# Patient Record
Sex: Female | Born: 1975 | Race: White | Hispanic: No | Marital: Married | Smoking: Former smoker
Health system: Southern US, Community
[De-identification: ages and names within clinical notes are randomized; demographics above are authoritative.]

## PROBLEM LIST (undated history)

## (undated) DIAGNOSIS — I10 Essential (primary) hypertension: Secondary | ICD-10-CM

## (undated) HISTORY — DX: Essential (primary) hypertension: I10

---

## 2005-04-27 LAB — HM HIV SCREENING LAB: HM HIV Screening: NEGATIVE

## 2013-05-22 ENCOUNTER — Ambulatory Visit: Payer: Self-pay | Admitting: Family Medicine

## 2014-12-03 ENCOUNTER — Ambulatory Visit
Admit: 2014-12-03 | Disposition: A | Discharge status: Home / Self Care | Payer: Self-pay | Attending: Family Medicine | Admitting: Family Medicine

## 2016-04-18 ENCOUNTER — Ambulatory Visit (INDEPENDENT_AMBULATORY_CARE_PROVIDER_SITE_OTHER): Payer: BLUE CROSS/BLUE SHIELD | Admitting: Family Medicine

## 2016-04-18 ENCOUNTER — Encounter: Payer: Self-pay | Admitting: Family Medicine

## 2016-04-18 VITALS — BP 102/62 | HR 70 | Ht 66.0 in | Wt 195.0 lb

## 2016-04-18 DIAGNOSIS — F329 Major depressive disorder, single episode, unspecified: Secondary | ICD-10-CM

## 2016-04-18 DIAGNOSIS — F32A Depression, unspecified: Secondary | ICD-10-CM

## 2016-04-18 DIAGNOSIS — Z0000 Encounter for general adult medical examination without abnormal findings: Secondary | ICD-10-CM | POA: Diagnosis not present

## 2016-04-18 DIAGNOSIS — Z124 Encounter for screening for malignant neoplasm of cervix: Secondary | ICD-10-CM

## 2016-04-18 MED ORDER — SERTRALINE HCL 50 MG PO TABS: 50.0000 mg | ORAL_TABLET | Freq: Every day | ORAL | 3 refills | Status: DC

## 2016-04-18 NOTE — Progress Notes (Signed)
Name: Shannon Velazquez   MRN: [id number]    DOB: 01/23/1976   Date:04/18/2016       Progress Note  Subjective  Chief Complaint  Chief Complaint  Patient presents with  . Annual Exam    with pap    Patient presents for pap and pelvic exam.    No problem-specific Assessment & Plan notes found for this encounter.   History reviewed. No pertinent past medical history.  History reviewed. No pertinent surgical history.  Family History  Problem Relation Age of Onset  . Diabetes Paternal Grandfather   . Heart disease Paternal Grandfather     Social History   Social History  . Marital status: Married    Spouse name: N/A  . Number of children: N/A  . Years of education: N/A   Occupational History  . Not on file.   Social History Main Topics  . Smoking status: Former Research scientist (life sciences)  . Smokeless tobacco: Never Used  . Alcohol use Not on file  . Drug use: Unknown  . Sexual activity: Yes   Other Topics Concern  . Not on file   Social History Narrative  . No narrative on file    Allergies not on file   Review of Systems  Reason unable to perform ROS: no menstrual issues/ no abn paps/ breast cysts noted in past.  Constitutional: Negative for chills, fever, malaise/fatigue and weight loss.  HENT: Negative for ear discharge, ear pain and sore throat.   Eyes: Negative for blurred vision.  Respiratory: Negative for cough, sputum production, shortness of breath and wheezing.   Cardiovascular: Negative for chest pain, palpitations and leg swelling.  Gastrointestinal: Negative for abdominal pain, blood in stool, constipation, diarrhea, heartburn, melena and nausea.  Genitourinary: Positive for dysuria, frequency and urgency. Negative for flank pain and hematuria.  Musculoskeletal: Positive for joint pain. Negative for back pain, myalgias and neck pain.  Skin: Negative for rash.  Neurological: Negative for dizziness, tingling, sensory change, focal weakness and headaches.   Endo/Heme/Allergies: Negative for environmental allergies and polydipsia. Bruises/bleeds easily.  Psychiatric/Behavioral: Positive for depression. Negative for suicidal ideas. The patient is not nervous/anxious and does not have insomnia.        Depressed mood/ activity advoidance     Objective  Vitals:   04/18/16 0953  BP: 102/62  Pulse: 70  Weight: 195 lb (88.5 kg)  Height: 5\' 6"  (1.676 m)    Physical Exam  Constitutional: She is well-developed, well-nourished, and in no distress. No distress.  HENT:  Head: Normocephalic and atraumatic.  Right Ear: External ear normal.  Left Ear: External ear normal.  Nose: Nose normal.  Mouth/Throat: Oropharynx is clear and moist.  Eyes: Conjunctivae and EOM are normal. Pupils are equal, round, and reactive to light. Right eye exhibits no discharge. Left eye exhibits no discharge.  Neck: Normal range of motion. Neck supple. No JVD present. No thyromegaly present.  Cardiovascular: Normal rate, regular rhythm, normal heart sounds and intact distal pulses.  Exam reveals no gallop and no friction rub.   No murmur heard. Pulmonary/Chest: Effort normal and breath sounds normal. No respiratory distress. She has no wheezes. She has no rales. Right breast exhibits no inverted nipple, no mass, no nipple discharge, no skin change and no tenderness. Left breast exhibits no inverted nipple, no mass, no nipple discharge, no skin change and no tenderness. Breasts are symmetrical.  Abdominal: Soft. Bowel sounds are normal. She exhibits no mass. There is no tenderness. There is no guarding.  Genitourinary: Vagina normal, uterus normal, cervix normal, right adnexa normal and left adnexa normal. Rectal exam shows guaiac negative stool. No vaginal discharge found.  Musculoskeletal: Normal range of motion. She exhibits no edema.  Lymphadenopathy:    She has no cervical adenopathy.  Neurological: She is alert. She has normal reflexes.  Skin: Skin is warm and dry.  She is not diaphoretic.  Psychiatric: Mood and affect normal.  Nursing note and vitals reviewed.     Assessment & Plan  Problem List Items Addressed This Visit    None    Visit Diagnoses    Annual physical exam    -  Primary   Relevant Orders   Pap IG (Image Guided)   Depression       Relevant Medications   sertraline (ZOLOFT) 50 MG tablet   Other Relevant Orders   Ambulatory referral to Psychiatry   Cervical cancer screening       Relevant Orders   Pap IG (Image Guided)        Dr. Otilio Miu [facility]  04/18/16

## 2016-04-20 LAB — PAP IG (IMAGE GUIDED): PAP SMEAR COMMENT: 0

## 2016-04-23 ENCOUNTER — Other Ambulatory Visit: Payer: Self-pay

## 2016-04-23 MED ORDER — SULFAMETHOXAZOLE-TRIMETHOPRIM 800-160 MG PO TABS: 1.0000 | ORAL_TABLET | Freq: Two times a day (BID) | ORAL | 0 refills | Status: DC

## 2016-05-21 ENCOUNTER — Other Ambulatory Visit: Payer: Self-pay

## 2016-05-25 ENCOUNTER — Other Ambulatory Visit: Payer: Self-pay

## 2016-05-25 ENCOUNTER — Telehealth: Payer: Self-pay

## 2016-05-25 NOTE — Telephone Encounter (Signed)
[facility] called to say that they have made "several attempts to sched an appt with the patient and have had no returned phone call". They are going to take the patient referral out of their work que as they are assuming she does not want to schedule appt with them. I tried to call the patient and had to leave a message

## 2017-05-06 ENCOUNTER — Encounter: Payer: Self-pay | Admitting: Family Medicine

## 2017-05-06 ENCOUNTER — Ambulatory Visit (INDEPENDENT_AMBULATORY_CARE_PROVIDER_SITE_OTHER): Payer: BLUE CROSS/BLUE SHIELD | Admitting: Family Medicine

## 2017-05-06 VITALS — BP 120/70 | HR 68 | Ht 66.0 in | Wt 204.0 lb

## 2017-05-06 DIAGNOSIS — Z6832 Body mass index (BMI) 32.0-32.9, adult: Secondary | ICD-10-CM

## 2017-05-06 DIAGNOSIS — Z23 Encounter for immunization: Secondary | ICD-10-CM | POA: Diagnosis not present

## 2017-05-06 DIAGNOSIS — Z0000 Encounter for general adult medical examination without abnormal findings: Secondary | ICD-10-CM

## 2017-05-06 DIAGNOSIS — E6609 Other obesity due to excess calories: Secondary | ICD-10-CM

## 2017-05-06 DIAGNOSIS — N6001 Solitary cyst of right breast: Secondary | ICD-10-CM

## 2017-05-06 DIAGNOSIS — Z1239 Encounter for other screening for malignant neoplasm of breast: Secondary | ICD-10-CM

## 2017-05-06 DIAGNOSIS — Z1231 Encounter for screening mammogram for malignant neoplasm of breast: Secondary | ICD-10-CM | POA: Diagnosis not present

## 2017-05-06 NOTE — Patient Instructions (Signed)

## 2017-05-06 NOTE — Progress Notes (Signed)
Name: Shannon Velazquez   MRN: [id number]    DOB: 08-Sep-1975   Date:05/06/2017       Progress Note  Subjective  Chief Complaint  Chief Complaint  Patient presents with  . Annual Exam    without pap- had last year- normal  . tdap    tdap needed    Patient presents for annual physical exam.    No problem-specific Assessment & Plan notes found for this encounter.   No past medical history on file.  No past surgical history on file.  Family History  Problem Relation Age of Onset  . Diabetes Paternal Grandfather   . Heart disease Paternal Grandfather     Social History   Social History  . Marital status: Married    Spouse name: N/A  . Number of children: N/A  . Years of education: N/A   Occupational History  . Not on file.   Social History Main Topics  . Smoking status: Former Research scientist (life sciences)  . Smokeless tobacco: Never Used  . Alcohol use Not on file  . Drug use: Unknown  . Sexual activity: Yes   Other Topics Concern  . Not on file   Social History Narrative  . No narrative on file    No Known Allergies  Outpatient Medications Prior to Visit  Medication Sig Dispense Refill  . sertraline (ZOLOFT) 50 MG tablet Take 1 tablet (50 mg total) by mouth daily. One half tablet tablet for 2 weeks then 1 tablet (Patient not taking: Reported on 05/06/2017) 30 tablet 3  . sulfamethoxazole-trimethoprim (BACTRIM DS,SEPTRA DS) 800-160 MG tablet Take 1 tablet by mouth 2 (two) times daily. 14 tablet 0   No facility-administered medications prior to visit.     Review of Systems  Constitutional: Negative for chills, fever, malaise/fatigue and weight loss.  HENT: Positive for congestion. Negative for ear discharge, ear pain and sore throat.   Eyes: Negative for blurred vision.  Respiratory: Negative for cough, sputum production, shortness of breath and wheezing.   Cardiovascular: Negative for chest pain, palpitations and leg swelling.  Gastrointestinal: Negative for abdominal  pain, blood in stool, constipation, diarrhea, heartburn, melena and nausea.  Genitourinary: Negative for dysuria, frequency, hematuria and urgency.  Musculoskeletal: Negative for back pain, joint pain, myalgias and neck pain.  Skin: Negative for rash.  Neurological: Negative for dizziness, tingling, sensory change, focal weakness and headaches.  Endo/Heme/Allergies: Negative for environmental allergies and polydipsia. Does not bruise/bleed easily.  Psychiatric/Behavioral: Negative for depression and suicidal ideas. The patient is not nervous/anxious and does not have insomnia.      Objective  Vitals:   05/06/17 0819  BP: 120/70  Pulse: 68  Weight: 204 lb (92.5 kg)  Height: 5\' 6"  (1.676 m)    Physical Exam  Constitutional: She is oriented to person, place, and time and well-developed, well-nourished, and in no distress. Vital signs are normal. No distress.  HENT:  Head: Normocephalic and atraumatic.  Right Ear: Tympanic membrane, external ear and ear canal normal.  Left Ear: Tympanic membrane, external ear and ear canal normal.  Nose: Nose normal.  Mouth/Throat: Uvula is midline and oropharynx is clear and moist. No oropharyngeal exudate, posterior oropharyngeal edema or posterior oropharyngeal erythema.  Eyes: Pupils are equal, round, and reactive to light. Conjunctivae, EOM and lids are normal. Right eye exhibits no discharge. Left eye exhibits no discharge.  Neck: Trachea normal and normal range of motion. Neck supple. Normal carotid pulses, no hepatojugular reflux and no JVD present. Carotid bruit is  not present. No thyromegaly present.  Cardiovascular: Normal rate, regular rhythm, S1 normal, S2 normal, intact distal pulses and normal pulses.  Exam reveals no gallop, no S3, no S4, no friction rub and no decreased pulses.   Murmur heard.  Systolic murmur is present with a grade of 1/6  Pulmonary/Chest: Effort normal and breath sounds normal. She has no decreased breath sounds. She  has no wheezes. She has no rhonchi. She has no rales. Right breast exhibits mass. Right breast exhibits no inverted nipple, no nipple discharge, no skin change and no tenderness. Left breast exhibits no inverted nipple, no mass, no nipple discharge, no skin change and no tenderness. Breasts are symmetrical.  Palpable nodule 10 o 'clock aureolar  Abdominal: Soft. Normal aorta and bowel sounds are normal. She exhibits no mass. There is no hepatosplenomegaly. There is no tenderness. There is no rigidity, no guarding and no CVA tenderness.  Musculoskeletal: Normal range of motion. She exhibits no edema.       Thoracic back: Normal.  Lymphadenopathy:       Head (right side): No submandibular adenopathy present.       Head (left side): No submandibular adenopathy present.    She has no cervical adenopathy.    She has no axillary adenopathy.  Neurological: She is alert and oriented to person, place, and time. She has normal sensation, normal strength, normal reflexes and intact cranial nerves.  Skin: Skin is warm, dry and intact. She is not diaphoretic.  Psychiatric: Mood and affect normal.      Assessment & Plan  Problem List Items Addressed This Visit    None    Visit Diagnoses    Annual physical exam    -  Primary   Relevant Orders   Renal Function Panel   Lipid Profile   Class 1 obesity due to excess calories without serious comorbidity with body mass index (BMI) of 32.0 to 32.9 in adult       Relevant Orders   Renal Function Panel   Lipid Profile   Influenza vaccine needed       Relevant Orders   Flu Vaccine QUAD 36+ mos IM (Completed)   Need for diphtheria-tetanus-pertussis (Tdap) vaccine       Relevant Orders   Tdap vaccine greater than or equal to 7yo IM (Completed)   Encounter for screening breast examination       Relevant Orders   MM Digital Screening   Breast cyst, right       Relevant Orders   MM Digital Screening      No orders of the defined types were placed in  this encounter.     Dr. Mac[facility]  05/06/17

## 2017-05-07 LAB — RENAL FUNCTION PANEL
Albumin: 4.6 g/dL (ref 3.5–5.5)
BUN/Creatinine Ratio: 11 (ref 9–23)
BUN: 9 mg/dL (ref 6–20)
CALCIUM: 9.4 mg/dL (ref 8.7–10.2)
CHLORIDE: 99 mmol/L (ref 96–106)
CO2: 25 mmol/L (ref 20–29)
Creatinine, Ser: 0.83 mg/dL (ref 0.57–1.00)
GFR calc non Af Amer: 89 mL/min/{1.73_m2} (ref >59)
GFR, EST AFRICAN AMERICAN: 103 mL/min/{1.73_m2} (ref >59)
Glucose: 86 mg/dL (ref 65–99)
Phosphorus: 3.3 mg/dL (ref 2.5–4.5)
Potassium: 4.4 mmol/L (ref 3.5–5.2)
Sodium: 139 mmol/L (ref 134–144)

## 2017-05-07 LAB — LIPID PANEL
CHOLESTEROL TOTAL: 157 mg/dL (ref 100–199)
Chol/HDL Ratio: 3.3 ratio (ref 0.0–4.4)
HDL: 47 mg/dL (ref >39)
LDL Calculated: 82 mg/dL (ref 0–99)
Triglycerides: 139 mg/dL (ref 0–149)
VLDL CHOLESTEROL CAL: 28 mg/dL (ref 5–40)

## 2017-05-16 ENCOUNTER — Other Ambulatory Visit: Payer: Self-pay

## 2017-06-10 ENCOUNTER — Ambulatory Visit
Admission: RE | Admit: 2017-06-10 | Discharge: 2017-06-10 | Disposition: A | Discharge status: Home / Self Care | Payer: Self-pay | Source: Ambulatory Visit | Attending: Oncology | Admitting: Oncology

## 2017-06-10 ENCOUNTER — Ambulatory Visit: Payer: Self-pay | Attending: Oncology

## 2017-06-10 VITALS — BP 173/94 | HR 102 | Temp 98.3°F | Ht 66.0 in | Wt 205.0 lb

## 2017-06-10 DIAGNOSIS — N63 Unspecified lump in unspecified breast: Secondary | ICD-10-CM

## 2017-06-10 NOTE — Progress Notes (Signed)
Subjective:     Patient ID: Shannon Velazquez, female   DOB: 03-28-1976, 41 y.o.   MRN: [id number]  HPI   Review of Systems     Objective:   Physical Exam  Pulmonary/Chest: Right breast exhibits no inverted nipple, no mass, no nipple discharge, no skin change and no tenderness. Left breast exhibits mass. Left breast exhibits no inverted nipple, no nipple discharge, no skin change and no tenderness. Breasts are asymmetrical.    Right breast smaller than left       Assessment:     41 year old patient presents for [facility] visit.   Patient is followed by Dr. Otilio Miu.  She has a history of right breast cyst. Patient screened, and meets BCCCP eligibility.  Patient does not have insurance, Medicare or Medicaid.  Handout given on Affordable Care Act.  Instructed patient on breast self-exam using teach back method. Presents today with complaint of left breast mass.  Palpated a 1 cm. mobile retroareolar mass at 12 o'clock in left breast.  Unable to palpate cyst previously identified in right breast.  Symmetrical bilaeral upper breast thickening.  Discussed elevated blood pressure, and pulse.  Patient states she is very anxious, and normally these readings are not elevated.    Plan:     Sent for bilateral diagnostic mammogram and ultrasound.

## 2017-06-19 NOTE — Progress Notes (Signed)
Radiologist discussed with patient benign findings on mammogram of a simple cyst.  Patient to return in one year for screening.  Copy to [facility].

## 2017-07-04 ENCOUNTER — Other Ambulatory Visit: Payer: Self-pay

## 2017-09-13 ENCOUNTER — Other Ambulatory Visit: Payer: Self-pay

## 2017-09-13 DIAGNOSIS — Z6832 Body mass index (BMI) 32.0-32.9, adult: Principal | ICD-10-CM

## 2017-09-13 DIAGNOSIS — Z0000 Encounter for general adult medical examination without abnormal findings: Secondary | ICD-10-CM

## 2017-09-13 DIAGNOSIS — E6609 Other obesity due to excess calories: Secondary | ICD-10-CM

## 2018-07-17 ENCOUNTER — Encounter: Payer: Self-pay | Admitting: Obstetrics and Gynecology

## 2018-07-17 ENCOUNTER — Encounter: Payer: BLUE CROSS/BLUE SHIELD | Admitting: Obstetrics and Gynecology

## 2018-07-17 ENCOUNTER — Ambulatory Visit (INDEPENDENT_AMBULATORY_CARE_PROVIDER_SITE_OTHER): Payer: BLUE CROSS/BLUE SHIELD | Admitting: Obstetrics and Gynecology

## 2018-07-17 ENCOUNTER — Other Ambulatory Visit (HOSPITAL_COMMUNITY)
Admission: RE | Admit: 2018-07-17 | Discharge: 2018-07-17 | Disposition: A | Discharge status: Home / Self Care | Payer: BLUE CROSS/BLUE SHIELD | Source: Ambulatory Visit | Attending: Obstetrics and Gynecology | Admitting: Obstetrics and Gynecology

## 2018-07-17 VITALS — BP 153/128 | HR 100 | Ht 66.0 in | Wt 205.6 lb

## 2018-07-17 DIAGNOSIS — Z01419 Encounter for gynecological examination (general) (routine) without abnormal findings: Secondary | ICD-10-CM | POA: Insufficient documentation

## 2018-07-17 DIAGNOSIS — E669 Obesity, unspecified: Secondary | ICD-10-CM | POA: Diagnosis not present

## 2018-07-17 NOTE — Progress Notes (Signed)
Subjective:   Shannon Velazquez is a 42 y.o. No obstetric history on file. Caucasian female here for a routine well-woman exam.  No LMP recorded.    Current complaints: none PCP: Ronnald Ramp       does desire labs  Social History: Sexual: heterosexual Marital Status: married Living situation: with family Occupation: homemaker Tobacco/alcohol: no tobacco use Illicit drugs: no history of illicit drug use  The following portions of the patient's history were reviewed and updated as appropriate: allergies, current medications, past family history, past medical history, past social history, past surgical history and problem list.  Past Medical History No past medical history on file.  Past Surgical History No past surgical history on file.  Gynecologic History No obstetric history on file.  No LMP recorded. Contraception: condoms Last Pap: 03/2016. Results were: normal Last mammogram: 05/2017. Results were: normal   Obstetric History OB History  No data available    Current Medications No current outpatient medications on file prior to visit.   No current facility-administered medications on file prior to visit.     Review of Systems Patient denies any headaches, blurred vision, shortness of breath, chest pain, abdominal pain, problems with bowel movements, urination, or intercourse.  Objective:  There were no vitals taken for this visit. Physical Exam  General:  Well developed, well nourished, no acute distress. She is alert and oriented x3. Skin:  Warm and dry Neck:  Midline trachea, no thyromegaly or nodules Cardiovascular: Regular rate and rhythm, no murmur heard Lungs:  Effort normal, all lung fields clear to auscultation bilaterally Breasts:  No dominant palpable mass, retraction, or nipple discharge Abdomen:  Soft, non tender, no hepatosplenomegaly or masses Pelvic:  External genitalia is normal in appearance.  The vagina is normal in appearance. The cervix is  bulbous, no CMT.  Thin prep pap is done with HR HPV cotesting. Uterus is felt to be normal size, shape, and contour.  No adnexal masses or tenderness noted. Extremities:  No swelling or varicosities noted Psych:  She has a normal mood and affect  Assessment:   Healthy well-woman exam Obesity   Plan:  Labs obtained-will follow up accordingly Flu vaccine UTD TdaP given 2 years ago. F/U 1 year for AE, or sooner if needed Mammogram ordered  Melody Rockney Ghee, CNM

## 2018-07-18 LAB — COMPREHENSIVE METABOLIC PANEL
A/G RATIO: 1.6 (ref 1.2–2.2)
ALK PHOS: 80 IU/L (ref 39–117)
ALT: 11 IU/L (ref 0–32)
AST: 15 IU/L (ref 0–40)
Albumin: 4.6 g/dL (ref 3.5–5.5)
BILIRUBIN TOTAL: 0.5 mg/dL (ref 0.0–1.2)
BUN/Creatinine Ratio: 10 (ref 9–23)
BUN: 8 mg/dL (ref 6–24)
CALCIUM: 9.5 mg/dL (ref 8.7–10.2)
CHLORIDE: 99 mmol/L (ref 96–106)
CO2: 20 mmol/L (ref 20–29)
Creatinine, Ser: 0.83 mg/dL (ref 0.57–1.00)
GFR calc Af Amer: 102 mL/min/{1.73_m2} (ref >59)
GFR calc non Af Amer: 88 mL/min/{1.73_m2} (ref >59)
Globulin, Total: 2.9 g/dL (ref 1.5–4.5)
Glucose: 88 mg/dL (ref 65–99)
POTASSIUM: 3.9 mmol/L (ref 3.5–5.2)
SODIUM: 137 mmol/L (ref 134–144)
Total Protein: 7.5 g/dL (ref 6.0–8.5)

## 2018-07-18 LAB — LIPID PANEL
CHOL/HDL RATIO: 3.5 ratio (ref 0.0–4.4)
Cholesterol, Total: 168 mg/dL (ref 100–199)
HDL: 48 mg/dL (ref >39)
LDL Calculated: 83 mg/dL (ref 0–99)
TRIGLYCERIDES: 185 mg/dL — AB (ref 0–149)
VLDL Cholesterol Cal: 37 mg/dL (ref 5–40)

## 2018-07-18 LAB — HEMOGLOBIN A1C
ESTIMATED AVERAGE GLUCOSE: 108 mg/dL
Hgb A1c MFr Bld: 5.4 % (ref 4.8–5.6)

## 2018-07-23 LAB — CYTOLOGY - PAP
DIAGNOSIS: NEGATIVE
HPV: NOT DETECTED

## 2018-07-30 ENCOUNTER — Encounter: Payer: BLUE CROSS/BLUE SHIELD | Admitting: Obstetrics and Gynecology

## 2018-08-06 ENCOUNTER — Ambulatory Visit
Admission: RE | Admit: 2018-08-06 | Discharge: 2018-08-06 | Disposition: A | Discharge status: Home / Self Care | Payer: BLUE CROSS/BLUE SHIELD | Source: Ambulatory Visit | Attending: Obstetrics and Gynecology | Admitting: Obstetrics and Gynecology

## 2018-08-06 DIAGNOSIS — Z01419 Encounter for gynecological examination (general) (routine) without abnormal findings: Secondary | ICD-10-CM

## 2019-11-14 ENCOUNTER — Ambulatory Visit: Payer: BC Managed Care – PPO | Attending: Internal Medicine

## 2019-11-14 DIAGNOSIS — Z23 Encounter for immunization: Secondary | ICD-10-CM

## 2019-11-14 NOTE — Progress Notes (Signed)
   Covid-19 Vaccination Clinic  Name:  Shannon Velazquez    MRN: [id number] DOB: Jul 11, 1976  11/14/2019  Shannon Velazquez was observed post Covid-19 immunization for 15 minutes without incident. She was provided with Vaccine Information Sheet and instruction to access the V-Safe system.   Shannon Velazquez was instructed to call 911 with any severe reactions post vaccine: Marland Kitchen Difficulty breathing  . Swelling of face and throat  . A fast heartbeat  . A bad rash all over body  . Dizziness and weakness   Immunizations Administered    Name Date Dose VIS Date Route   Pfizer COVID-19 Vaccine 11/14/2019 12:58 PM 0.3 mL 08/07/2019 Intramuscular   Manufacturer: [place]   Lot: [id number]   [place]: [id number]

## 2019-12-08 ENCOUNTER — Ambulatory Visit: Payer: BC Managed Care – PPO | Attending: Internal Medicine

## 2019-12-08 DIAGNOSIS — Z23 Encounter for immunization: Secondary | ICD-10-CM

## 2019-12-08 NOTE — Progress Notes (Signed)
   Covid-19 Vaccination Clinic  Name:  Shannon Velazquez    MRN: [id number] DOB: December 12, 1975  12/08/2019  Shannon Velazquez was observed post Covid-19 immunization for 15 minutes without incident. She was provided with Vaccine Information Sheet and instruction to access the V-Safe system.   Shannon Velazquez was instructed to call 911 with any severe reactions post vaccine: Marland Kitchen Difficulty breathing  . Swelling of face and throat  . A fast heartbeat  . A bad rash all over body  . Dizziness and weakness   Immunizations Administered    Name Date Dose VIS Date Route   Pfizer COVID-19 Vaccine 12/08/2019  1:51 PM 0.3 mL 08/07/2019 Intramuscular   Manufacturer: [place]   Lot: [id number]   [place]: [id number]

## 2020-05-17 ENCOUNTER — Telehealth: Payer: Self-pay | Admitting: Obstetrics and Gynecology

## 2020-05-19 NOTE — Telephone Encounter (Signed)
error 

## 2020-05-31 ENCOUNTER — Encounter: Payer: BLUE CROSS/BLUE SHIELD | Admitting: Obstetrics and Gynecology

## 2020-06-01 ENCOUNTER — Encounter: Payer: Self-pay | Admitting: Obstetrics and Gynecology

## 2020-06-01 ENCOUNTER — Ambulatory Visit (INDEPENDENT_AMBULATORY_CARE_PROVIDER_SITE_OTHER): Payer: BC Managed Care – PPO | Admitting: Obstetrics and Gynecology

## 2020-06-01 ENCOUNTER — Other Ambulatory Visit: Payer: Self-pay | Admitting: Obstetrics and Gynecology

## 2020-06-01 ENCOUNTER — Other Ambulatory Visit: Payer: Self-pay

## 2020-06-01 VITALS — BP 137/90 | HR 83 | Ht 66.0 in | Wt 188.1 lb

## 2020-06-01 DIAGNOSIS — Z01419 Encounter for gynecological examination (general) (routine) without abnormal findings: Secondary | ICD-10-CM | POA: Diagnosis not present

## 2020-06-01 DIAGNOSIS — Z1231 Encounter for screening mammogram for malignant neoplasm of breast: Secondary | ICD-10-CM

## 2020-06-01 NOTE — Progress Notes (Signed)
HPI:      Ms. Shannon Velazquez is a 44 y.o. G2P2 who LMP was Patient's last menstrual period was 05/14/2020.  Subjective:   She presents today for her annual examination.  She states that her periods are getting closer together and that she feels like she has some mood change issues that occur the week before her period.  She does not want to take medicine unless "I have to". She is up-to-date on her Pap smear-she needs a mammogram and blood work. Currently using condoms for birth control and not interested in other methods at this time.    Hx: The following portions of the patient's history were reviewed and updated as appropriate:             She  has a past medical history of White coat syndrome with diagnosis of hypertension. She does not have a problem list on file. She  has a past surgical history that includes Cesarean section. Her family history includes Diabetes in her paternal grandfather; Heart disease in her paternal grandfather. She  reports that she has quit smoking. She has never used smokeless tobacco. She reports current alcohol use. She reports that she does not use drugs. She currently has no medications in their medication list. She has No Known Allergies.       Review of Systems:  Review of Systems  Constitutional: Denied constitutional symptoms, night sweats, recent illness, fatigue, fever, insomnia and weight loss.  Eyes: Denied eye symptoms, eye pain, photophobia, vision change and visual disturbance.  Ears/Nose/Throat/Neck: Denied ear, nose, throat or neck symptoms, hearing loss, nasal discharge, sinus congestion and sore throat.  Cardiovascular: Denied cardiovascular symptoms, arrhythmia, chest pain/pressure, edema, exercise intolerance, orthopnea and palpitations.  Respiratory: Denied pulmonary symptoms, asthma, pleuritic pain, productive sputum, cough, dyspnea and wheezing.  Gastrointestinal: Denied, gastro-esophageal reflux, melena, nausea and vomiting.   Genitourinary: Denied genitourinary symptoms including symptomatic vaginal discharge, pelvic relaxation issues, and urinary complaints.  Musculoskeletal: Denied musculoskeletal symptoms, stiffness, swelling, muscle weakness and myalgia.  Dermatologic: Denied dermatology symptoms, rash and scar.  Neurologic: Denied neurology symptoms, dizziness, headache, neck pain and syncope.  Psychiatric: Denied psychiatric symptoms, anxiety and depression.  Endocrine: Denied endocrine symptoms including hot flashes and night sweats.   Meds:   No current outpatient medications on file prior to visit.   No current facility-administered medications on file prior to visit.     Upstream - 06/01/20 0809      Pregnancy Intention Screening   Does the patient want to become pregnant in the next year? No    Does the patient's partner want to become pregnant in the next year? No    Would the patient like to discuss contraceptive options today? No      Contraception Wrap Up   Current Method Female Condom    End Method Female Condom    Contraception Counseling Provided No          The pregnancy intention screening data noted above was reviewed. Potential methods of contraception were discussed. The patient elected to proceed with Female Condom.   Objective:     Vitals:   06/01/20 0758  BP: 137/90  Pulse: 83    Filed Weights   06/01/20 0758  Weight: 188 lb 1.6 oz (85.3 kg)              Physical examination General NAD, Conversant  HEENT Atraumatic; Op clear with mmm.  Normo-cephalic. Pupils reactive. Anicteric sclerae  Thyroid/Neck Smooth without nodularity or enlargement.  Normal ROM.  Neck Supple.  Skin No rashes, lesions or ulceration. Normal palpated skin turgor. No nodularity.  Breasts: No masses or discharge.  Symmetric.  No axillary adenopathy.  Lungs: Clear to auscultation.No rales or wheezes. Normal Respiratory effort, no retractions.  Heart: NSR.  No murmurs or rubs appreciated. No  periferal edema  Abdomen: Soft.  Non-tender.  No masses.  No HSM. No hernia  Extremities: Moves all appropriately.  Normal ROM for age. No lymphadenopathy.  Neuro: Oriented to PPT.  Normal mood. Normal affect.     Pelvic:   Vulva: Normal appearance.  No lesions.  Vagina: No lesions or abnormalities noted.  Support: Normal pelvic support.  Urethra No masses tenderness or scarring.  Meatus Normal size without lesions or prolapse.  Cervix: Normal appearance.  No lesions.  Anus: Normal exam.  No lesions.  Perineum: Normal exam.  No lesions.        Bimanual   Uterus: Normal size.  Non-tender.  Mobile.  AV.  Adnexae: No masses.  Non-tender to palpation.  Cul-de-sac: Negative for abnormality.      Assessment:    G2P2 There are no problems to display for this patient.    1. Well woman exam with routine gynecological exam     Patient with some mild mood changes.   Plan:            1.  Basic Screening Recommendations The basic screening recommendations for asymptomatic women were discussed with the patient during her visit.  The age-appropriate recommendations were discussed with her and the rational for the tests reviewed.  When I am informed by the patient that another primary care physician has previously obtained the age-appropriate tests and they are up-to-date, only outstanding tests are ordered and referrals given as necessary.  Abnormal results of tests will be discussed with her when all of her results are completed.  Routine preventative health maintenance measures emphasized: Exercise/Diet/Weight control, Tobacco Warnings, Alcohol/Substance use risks and Stress Management Lab work today-mammogram ordered-Pap next year 2.  We have discussed climacteric and mood changes associated with declining estrogen levels despite having regular menses.  All of her questions were answered.  She does not want to take any form of hormones at this time to try to affect these issues.  She is  not interested in medications like Zoloft for PMDD. If her condition worsens or if she begins having other problems she will contact us. Orders Orders Placed This Encounter  Procedures  . Hemoglobin A1c  . Lipid panel  . TSH    No orders of the defined types were placed in this encounter.           F/U  Return in about 1 year (around 06/01/2021) for Annual Physical.  Finis Bud, M.D. 06/01/2020 8:41 AM

## 2020-06-02 LAB — LIPID PANEL
Chol/HDL Ratio: 2.8 ratio (ref 0.0–4.4)
Cholesterol, Total: 186 mg/dL (ref 100–199)
HDL: 66 mg/dL (ref >39)
LDL Chol Calc (NIH): 108 mg/dL — ABNORMAL HIGH (ref 0–99)
Triglycerides: 66 mg/dL (ref 0–149)
VLDL Cholesterol Cal: 12 mg/dL (ref 5–40)

## 2020-06-02 LAB — TSH: TSH: 1.93 u[IU]/mL (ref 0.450–4.500)

## 2020-06-02 LAB — HEMOGLOBIN A1C
Est. average glucose Bld gHb Est-mCnc: 108 mg/dL
Hgb A1c MFr Bld: 5.4 % (ref 4.8–5.6)

## 2020-08-08 ENCOUNTER — Other Ambulatory Visit: Payer: Self-pay

## 2020-08-08 ENCOUNTER — Ambulatory Visit
Admission: RE | Admit: 2020-08-08 | Discharge: 2020-08-08 | Disposition: A | Discharge status: Home / Self Care | Payer: BC Managed Care – PPO | Source: Ambulatory Visit | Attending: Obstetrics and Gynecology | Admitting: Obstetrics and Gynecology

## 2020-08-08 DIAGNOSIS — Z1231 Encounter for screening mammogram for malignant neoplasm of breast: Secondary | ICD-10-CM | POA: Insufficient documentation

## 2021-06-07 ENCOUNTER — Encounter: Payer: BC Managed Care – PPO | Admitting: Obstetrics and Gynecology

## 2021-10-13 ENCOUNTER — Ambulatory Visit (INDEPENDENT_AMBULATORY_CARE_PROVIDER_SITE_OTHER): Payer: BC Managed Care – PPO | Admitting: Obstetrics

## 2021-10-13 ENCOUNTER — Other Ambulatory Visit: Payer: Self-pay

## 2021-10-13 ENCOUNTER — Encounter: Payer: Self-pay | Admitting: Obstetrics

## 2021-10-13 VITALS — BP 144/89 | HR 89 | Ht 66.0 in | Wt 204.5 lb

## 2021-10-13 DIAGNOSIS — Z01419 Encounter for gynecological examination (general) (routine) without abnormal findings: Secondary | ICD-10-CM

## 2021-10-13 DIAGNOSIS — N6009 Solitary cyst of unspecified breast: Secondary | ICD-10-CM

## 2021-10-13 NOTE — Progress Notes (Signed)
SUBJECTIVE  HPI  Shannon Velazquez is a 46 y.o.-year-old female who presents for an annual physical today. She reports a potential change in a cyst in her left breast and would like a mammography referral today. She has no other concerns today. Her period is regular q 21 days. She had been experiencing some mood concerns last year, but she states that this has resolved.  Medical/Surgical History Past Medical History:  Diagnosis Date   White coat syndrome with diagnosis of hypertension    Past Surgical History:  Procedure Laterality Date   CESAREAN SECTION      Social History Lives with husband and children Sexually active with husband Exercise: None Substances  Obstetric History OB History     Gravida  2   Para  2   Term      Preterm      AB      Living         SAB      IAB      Ectopic      Multiple      Live Births               GYN/Menstrual History Patient's last menstrual period was 10/11/2021 (exact date). regular periods every 21 days Last Pap: Contraception:  Prevention Endorses regular dental and eye exams Mammogram: Yearly Flu shot/vaccines: Up to date on flu and COVID vaccines  Current Medications Outpatient Medications Prior to Visit  Medication Sig   Loratadine (CLARITIN PO) Take by mouth.   No facility-administered medications prior to visit.      Up[facility] - 10/13/21 1017       Pregnancy Intention Screening   Does the patient want to become pregnant in the next year? No    Does the patient's partner want to become pregnant in the next year? No    Would the patient like to discuss contraceptive options today? No      Contraception Wrap Up   Current Method Vasectomy    End Method Vasectomy    Contraception Counseling Provided No            The pregnancy intention screening data noted above was reviewed. Potential methods of contraception were discussed. The patient elected to proceed with Vasectomy.    ROS History obtained from the patient General ROS: negative for - chills, night sweats, or sleep disturbance Psychological ROS: negative for - anxiety or depression Allergy and Immunology ROS: positive for - postnasal drip and seasonal allergies negative for - hives or nasal congestion Hematological and Lymphatic ROS: negative for - blood clots, night sweats, swollen lymph nodes, or weight loss Endocrine ROS: negative for - mood swings, palpitations, polydipsia/polyuria, or unexpected weight changes Breast ROS: positive for - new or changing breast lumps Respiratory ROS: no cough, shortness of breath, or wheezing Cardiovascular ROS: no chest pain or dyspnea on exertion Gastrointestinal ROS: no abdominal pain, change in bowel habits, or black or bloody stools Genito-Urinary ROS: no dysuria, trouble voiding, or hematuria negative for - dysmenorrhea or genital discharge Musculoskeletal ROS: negative for - joint pain or muscle pain Dermatological ROS: negative for eczema and rash     OBJECTIVE  Last Weight  Most recent update: 10/13/2021  9:04 AM    Weight  92.8 kg (204 lb 8 oz)             Body mass index is 33.01 kg/m.    BP (!) 144/89    Pulse 89    Ht 5'  6" (1.676 m)    Wt 204 lb 8 oz (92.8 kg)    LMP 10/11/2021 (Exact Date)    BMI 33.01 kg/m  General appearance: alert, cooperative, and appears stated age Head: Normocephalic, without obvious abnormality, atraumatic Neck: no adenopathy, supple, symmetrical, trachea midline, and thyroid not enlarged, symmetric, no tenderness/mass/nodules Lungs: clear to auscultation bilaterally Breasts: normal appearance, no masses or tenderness, No axillary or supraclavicular adenopathy, positive findings: 1cm cm, smooth, round, and mobile nodule located on the left behind the areola. Breast milk expressed with palpation Heart: regular rate and rhythm, S1, S2 normal, no murmur, click, rub or gallop Abdomen: soft, non-tender; bowel sounds  normal; no masses,  no organomegaly Pelvic: external genitalia normal, no adnexal masses or tenderness, no cervical motion tenderness, and uterus normal size, shape, and consistency Extremities: extremities normal, atraumatic, no cyanosis or edema Pulses: 2+ and symmetric Skin: Skin color, texture, turgor normal. No rashes or lesions Lymph nodes: Cervical, supraclavicular, and axillary nodes normal.  ASSESSMENT  1) Annual exam. Pap due at next annual visit. 2) Cyst in left breast. Breast milk with expression. 3) Desires routine lab work  PLAN 1) Physical exam as noted. Discussed healthy lifestyle choices and regular exercise. 2) Referral for diagnostic mammogram sent. Prolactin level drawn. 3) Lipid profile, TSH, CBC, A1C drawn today.  Return in one year for annual exam or as needed for concerns.   Lloyd Huger, CNM

## 2021-10-14 LAB — HEMOGLOBIN A1C
Est. average glucose Bld gHb Est-mCnc: 108 mg/dL
Hgb A1c MFr Bld: 5.4 % (ref 4.8–5.6)

## 2021-10-14 LAB — CBC WITH DIFFERENTIAL/PLATELET
Basophils Absolute: 0 10*3/uL (ref 0.0–0.2)
Basos: 0 %
EOS (ABSOLUTE): 0.1 10*3/uL (ref 0.0–0.4)
Eos: 1 %
Hematocrit: 44.1 % (ref 34.0–46.6)
Hemoglobin: 14.2 g/dL (ref 11.1–15.9)
Immature Grans (Abs): 0 10*3/uL (ref 0.0–0.1)
Immature Granulocytes: 0 %
Lymphocytes Absolute: 1.5 10*3/uL (ref 0.7–3.1)
Lymphs: 26 %
MCH: 26.9 pg (ref 26.6–33.0)
MCHC: 32.2 g/dL (ref 31.5–35.7)
MCV: 84 fL (ref 79–97)
Monocytes Absolute: 0.3 10*3/uL (ref 0.1–0.9)
Monocytes: 6 %
Neutrophils Absolute: 3.9 10*3/uL (ref 1.4–7.0)
Neutrophils: 67 %
Platelets: 406 10*3/uL (ref 150–450)
RBC: 5.28 x10E6/uL (ref 3.77–5.28)
RDW: 14.1 % (ref 11.7–15.4)
WBC: 5.8 10*3/uL (ref 3.4–10.8)

## 2021-10-14 LAB — LIPID PANEL
Chol/HDL Ratio: 3.1 ratio (ref 0.0–4.4)
Cholesterol, Total: 188 mg/dL (ref 100–199)
HDL: 61 mg/dL (ref >39)
LDL Chol Calc (NIH): 107 mg/dL — ABNORMAL HIGH (ref 0–99)
Triglycerides: 111 mg/dL (ref 0–149)
VLDL Cholesterol Cal: 20 mg/dL (ref 5–40)

## 2021-10-14 LAB — PROLACTIN: Prolactin: 22.5 ng/mL (ref 4.8–23.3)

## 2021-10-14 LAB — TSH: TSH: 1.73 u[IU]/mL (ref 0.450–4.500)

## 2021-10-19 ENCOUNTER — Other Ambulatory Visit: Payer: Self-pay | Admitting: Obstetrics

## 2021-10-19 DIAGNOSIS — N6009 Solitary cyst of unspecified breast: Secondary | ICD-10-CM

## 2021-10-19 DIAGNOSIS — Z01419 Encounter for gynecological examination (general) (routine) without abnormal findings: Secondary | ICD-10-CM

## 2021-10-26 ENCOUNTER — Encounter: Payer: Self-pay | Admitting: Obstetrics

## 2021-10-26 ENCOUNTER — Ambulatory Visit
Admission: RE | Admit: 2021-10-26 | Discharge: 2021-10-26 | Disposition: A | Discharge status: Home / Self Care | Payer: BC Managed Care – PPO | Source: Ambulatory Visit | Attending: Obstetrics | Admitting: Obstetrics

## 2021-10-26 ENCOUNTER — Other Ambulatory Visit: Payer: Self-pay

## 2021-10-26 DIAGNOSIS — N6009 Solitary cyst of unspecified breast: Secondary | ICD-10-CM | POA: Diagnosis present

## 2021-10-26 DIAGNOSIS — Z01419 Encounter for gynecological examination (general) (routine) without abnormal findings: Secondary | ICD-10-CM

## 2021-10-27 ENCOUNTER — Other Ambulatory Visit: Payer: Self-pay | Admitting: Obstetrics

## 2021-10-27 DIAGNOSIS — N63 Unspecified lump in unspecified breast: Secondary | ICD-10-CM

## 2021-10-27 DIAGNOSIS — R928 Other abnormal and inconclusive findings on diagnostic imaging of breast: Secondary | ICD-10-CM

## 2021-10-27 DIAGNOSIS — R921 Mammographic calcification found on diagnostic imaging of breast: Secondary | ICD-10-CM

## 2021-11-02 ENCOUNTER — Other Ambulatory Visit: Payer: BC Managed Care – PPO

## 2021-11-03 ENCOUNTER — Other Ambulatory Visit: Payer: Self-pay | Admitting: Obstetrics

## 2021-11-03 MED ORDER — ALPRAZOLAM 0.5 MG PO TABS: 0.5000 mg | ORAL_TABLET | ORAL | 0 refills | Status: DC | PRN

## 2021-11-03 NOTE — Progress Notes (Signed)
Alprazolam 0.5 mg 2 tabs sent for pre-procedural anxiety at pt request. ? ?M. Norberto Sorenson, CNM ?

## 2021-11-16 ENCOUNTER — Ambulatory Visit
Admission: RE | Admit: 2021-11-16 | Discharge: 2021-11-16 | Disposition: A | Discharge status: Home / Self Care | Payer: BC Managed Care – PPO | Source: Ambulatory Visit | Attending: Obstetrics | Admitting: Obstetrics

## 2021-11-16 ENCOUNTER — Other Ambulatory Visit: Payer: Self-pay

## 2021-11-16 DIAGNOSIS — N6002 Solitary cyst of left breast: Secondary | ICD-10-CM | POA: Diagnosis present

## 2021-11-16 DIAGNOSIS — R928 Other abnormal and inconclusive findings on diagnostic imaging of breast: Secondary | ICD-10-CM | POA: Insufficient documentation

## 2021-11-16 DIAGNOSIS — D242 Benign neoplasm of left breast: Secondary | ICD-10-CM | POA: Insufficient documentation

## 2021-11-16 DIAGNOSIS — N6452 Nipple discharge: Secondary | ICD-10-CM | POA: Diagnosis present

## 2021-11-16 DIAGNOSIS — R921 Mammographic calcification found on diagnostic imaging of breast: Secondary | ICD-10-CM

## 2021-11-16 DIAGNOSIS — N63 Unspecified lump in unspecified breast: Secondary | ICD-10-CM

## 2021-11-16 HISTORY — PX: BREAST BIOPSY: SHX20

## 2021-11-17 LAB — SURGICAL PATHOLOGY

## 2022-07-04 ENCOUNTER — Ambulatory Visit (INDEPENDENT_AMBULATORY_CARE_PROVIDER_SITE_OTHER): Payer: BC Managed Care – PPO | Admitting: Family Medicine

## 2022-07-04 ENCOUNTER — Encounter: Payer: Self-pay | Admitting: Family Medicine

## 2022-07-04 VITALS — BP 146/84 | HR 90 | Ht 66.0 in | Wt 205.0 lb

## 2022-07-04 DIAGNOSIS — R030 Elevated blood-pressure reading, without diagnosis of hypertension: Secondary | ICD-10-CM | POA: Diagnosis not present

## 2022-07-04 DIAGNOSIS — R011 Cardiac murmur, unspecified: Secondary | ICD-10-CM

## 2022-07-04 DIAGNOSIS — E7800 Pure hypercholesterolemia, unspecified: Secondary | ICD-10-CM

## 2022-07-04 DIAGNOSIS — J3089 Other allergic rhinitis: Secondary | ICD-10-CM | POA: Diagnosis not present

## 2022-07-04 DIAGNOSIS — Z7689 Persons encountering health services in other specified circumstances: Secondary | ICD-10-CM | POA: Diagnosis not present

## 2022-07-04 NOTE — Progress Notes (Signed)
Subjective:    Patient ID: Shannon Velazquez, female    DOB: 08/22/1976, 46 y.o.   MRN: [id number]  Shannon Velazquez is a 46 y.o. female presenting on 07/04/2022 for Establish Care  Previous PCP with Dr Ronnald Ramp Followed by Harle [facility] regularly  HPI  Elevated BP without HYPERTENSION She admits occasional elevated HR at times.  She does not take medication Past readings in other doctors office with variable higher readings Goal to improve her diet and lower caffeine Goal to improve exercise  Heart Murmur Previously documented by PCP, mild    HYPERLIPIDEMIA: - Reports concerns. Last lipid panel 09/2021, mild elevated LDL 107 Not on medication   Followed Dermatology  Seasonal Environmental Allergies Followed by [facility], she has done the allergy skin testing for environmental triggers. She has variety of triggers. - Treated with meds Levocetirizine, Singulair, Azelastine nasal. Significant improvement.  Health Maintenance:  History of Abnormal Breast Lump / Mammogram Her GYN referred to have diagnostic mammogram and ultrasound, calcification and aspiration biopsy done negative, repeat in 1 year.     07/04/2022   10:27 AM 06/01/2020    8:09 AM 05/06/2017    8:20 AM  Depression screen PHQ 2/9  Decreased Interest 0 0 0  Down, Depressed, Hopeless 0 0 0  PHQ - 2 Score 0 0 0  Altered sleeping  0 0  Tired, decreased energy  1 0  Change in appetite  1 0  Feeling bad or failure about yourself   0 0  Trouble concentrating  0 0  Moving slowly or fidgety/restless  0 0  Suicidal thoughts  0 0  PHQ-9 Score  2 0  Difficult doing work/chores  Not difficult at all Not difficult at all      07/04/2022   10:27 AM  GAD 7 : Generalized Anxiety Score  Nervous, Anxious, on Edge 1  Control/stop worrying 0  Worry too much - different things 0  Trouble relaxing 0  Restless 0  Easily annoyed or irritable 0  Afraid - awful might happen 0  Total GAD 7 Score 1   Anxiety Difficulty Not difficult at all     Past Medical History:  Diagnosis Date   White coat syndrome with diagnosis of hypertension    Past Surgical History:  Procedure Laterality Date   BREAST BIOPSY Left 11/16/2021   stereo bx-calcs, "X" clip-path pending   CESAREAN SECTION     Social History   Socioeconomic History   Marital status: Married    Spouse name: Not on file   Number of children: Not on file   Years of education: Not on file   Highest education level: Not on file  Occupational History   Not on file  Tobacco Use   Smoking status: Former    Packs/day: 0.25    Years: 10.00    Total pack years: 2.50    Types: Cigarettes    Quit date: 08/27/2008    Years since quitting: 13.8   Smokeless tobacco: Never  Vaping Use   Vaping Use: Never used  Substance and Sexual Activity   Alcohol use: Yes    Comment: very rarely   Drug use: Never   Sexual activity: Yes    Birth control/protection: Surgical    Comment: husband - vasectomy  Other Topics Concern   Not on file  Social History Narrative   Not on file   Social Determinants of Health   Financial Resource Strain: Not on file  Food Insecurity:  Not on file  Transportation Needs: Not on file  Physical Activity: Not on file  Stress: Not on file  Social Connections: Not on file  Intimate Partner Violence: Not on file   Family History  Problem Relation Age of Onset   Hypertension Father    Diabetes Paternal Grandmother    Hyperlipidemia Paternal Grandfather    Diabetes Paternal Grandfather    Heart disease Paternal Grandfather    Heart attack Paternal Grandfather    Breast cancer Other        maternal grandmother's sister   Breast cancer Paternal Aunt 84   Current Outpatient Medications on File Prior to Visit  Medication Sig   Azelastine HCl 137 MCG/SPRAY SOLN Place into both nostrils.   levocetirizine (XYZAL ALLERGY 24HR) 5 MG tablet    montelukast (SINGULAIR) 10 MG tablet Take 10 mg by mouth at  bedtime.   No current facility-administered medications on file prior to visit.    Review of Systems Per HPI unless specifically indicated above      Objective:    BP (!) 146/84 (BP Location: Left Arm, Cuff Size: Normal)   Pulse 90   Ht '5\' 6"'$  (1.676 m)   Wt 205 lb (93 kg)   SpO2 100%   BMI 33.09 kg/m   Wt Readings from Last 3 Encounters:  07/04/22 205 lb (93 kg)  10/13/21 204 lb 8 oz (92.8 kg)  06/01/20 188 lb 1.6 oz (85.3 kg)    Physical Exam Vitals and nursing note reviewed.  Constitutional:      General: She is not in acute distress.    Appearance: She is well-developed. She is not diaphoretic.     Comments: Well-appearing, comfortable, cooperative  HENT:     Head: Normocephalic and atraumatic.  Eyes:     General:        Right eye: No discharge.        Left eye: No discharge.     Conjunctiva/sclera: Conjunctivae normal.  Neck:     Thyroid: No thyromegaly.  Cardiovascular:     Rate and Rhythm: Normal rate and regular rhythm.     Heart sounds: Murmur (1/6 systolic aortic listening post) heard.  Pulmonary:     Effort: Pulmonary effort is normal. No respiratory distress.     Breath sounds: Normal breath sounds. No wheezing or rales.  Musculoskeletal:        General: Normal range of motion.     Cervical back: Normal range of motion and neck supple.  Lymphadenopathy:     Cervical: No cervical adenopathy.  Skin:    General: Skin is warm and dry.     Findings: No erythema or rash.  Neurological:     Mental Status: She is alert and oriented to person, place, and time.  Psychiatric:        Behavior: Behavior normal.     Comments: Well groomed, good eye contact, normal speech and thoughts      Results for orders placed or performed in visit on 07/04/22  HM HIV SCREENING LAB  Result Value Ref Range   HM HIV Screening Negative - Patient reported       Assessment & Plan:   Problem List Items Addressed This Visit     Elevated BP without diagnosis of  hypertension - Primary   Environmental and seasonal allergies   Heart murmur   Pure hypercholesterolemia   Other Visit Diagnoses     Encounter to establish care with new doctor  Establish care here with new PCP  #Seasonal Environmental Allergies Continue current regimen per ENT, seems seasonal can use AS NEEDED Follow up if need further assistance  #Hyperlipidemia, mild LDL elevated The 10-year ASCVD risk score (Arnett DK, et al., 2019) is: 0.7% Not indicated for statin or medication Encourage lifestyle, briefly reviewed last lab, follow up at physical  #Heart Murmur 1/6 faint murmur heard today, asymptomatic, previously documented Monitor clinically, no further diagnostic testing indicated at this time.  Elevated BP without HYPERTENSION Possible White Coat Syndrome w/ pattern of elevated BP in offices Improved on re-check Recommend home BP monitoring, log and follow-up   No orders of the defined types were placed in this encounter.    Follow up plan: Return in about 4 months (around 11/02/2022) for 4 month Annual Physical.  No labs ordered for physical 10/2022 yet, will follow w/ labs done by GYN previous month and recommended some labs to be drawn by GYN including [place], DO [facility] 07/04/2022, 10:02 AM

## 2022-07-04 NOTE — Patient Instructions (Addendum)
Thank you for coming to the office today.  Double check with your insurance See if they will allow / cover you to do TWO preventative wellness visits per calendar year, one with GYN and one with PCP  ICD10 code Z00 Annual Wellness / Prevention code  Add the Comprehensive Metabolic Panel (CMET, CMP) to your GYN blood work.  If we cannot do both preventative visits, then we can do a routine office visit code  Recommend a Home BP cuff - for example Omron, arm self inflating cuff, keep track of BP few times a week, write down log. Bring to next visit, can bring the cuff as well we can check it.  Colon Cancer Screening: - For all adults age 45+ routine colon cancer screening is highly recommended.     - Recent guidelines from [place] recommend starting age of 45 - Early detection of colon cancer is important, because often there are no warning signs or symptoms, also if found early usually it can be cured. Late stage is hard to treat.  - If you are not interested in Colonoscopy screening (if done and normal you could be cleared for 5 to 10 years until next due), then Cologuard is an excellent alternative for screening test for Colon Cancer. It is highly sensitive for detecting DNA of colon cancer from even the earliest stages. Also, there is NO bowel prep required. - If Cologuard is NEGATIVE, then it is good for 3 years before next due - If Cologuard is POSITIVE, then it is strongly advised to get a Colonoscopy, which allows the GI doctor to locate the source of the cancer or polyp (even very early stage) and treat it by removing it. ------------------------- If you would like to proceed with Cologuard (stool DNA test) - FIRST, call your insurance company and tell them you want to check cost of Cologuard tell them CPT Code [phone] (it may be completely covered and you could get for no cost, OR max cost without any coverage is about $600). Also, keep in mind if you do NOT open the kit,  and decide not to do the test, you will NOT be charged, you should contact the company if you decide not to do the test. - If you want to proceed, you can notify us (phone message, [place], or at next visit) and we will order it for you. The test kit will be delivered to you house within about 1 week. Follow instructions to collect sample, you may call the company for any help or questions, 24/7 telephone support at [phone].   Please schedule a Follow-up Appointment to: Return in about 4 months (around 11/02/2022) for 4 month Annual Physical.  If you have any other questions or concerns, please feel free to call the office or send a message through [place]. You may also schedule an earlier appointment if necessary.  Additionally, you may be receiving a survey about your experience at our office within a few days to 1 week by e-mail or mail. We value your feedback.  Nobie Putnam, DO [place]

## 2022-08-28 IMAGING — MG MM BREAST LOCALIZATION CLIP
4 series · 4 of 12 positions shown · non-contrast
Comparison: Previous exam(s).

CLINICAL DATA: Status post stereotactic guided biopsy

EXAM:
3D DIAGNOSTIC LEFT MAMMOGRAM POST STEREOTACTIC BIOPSY

[L CC synth-2D]
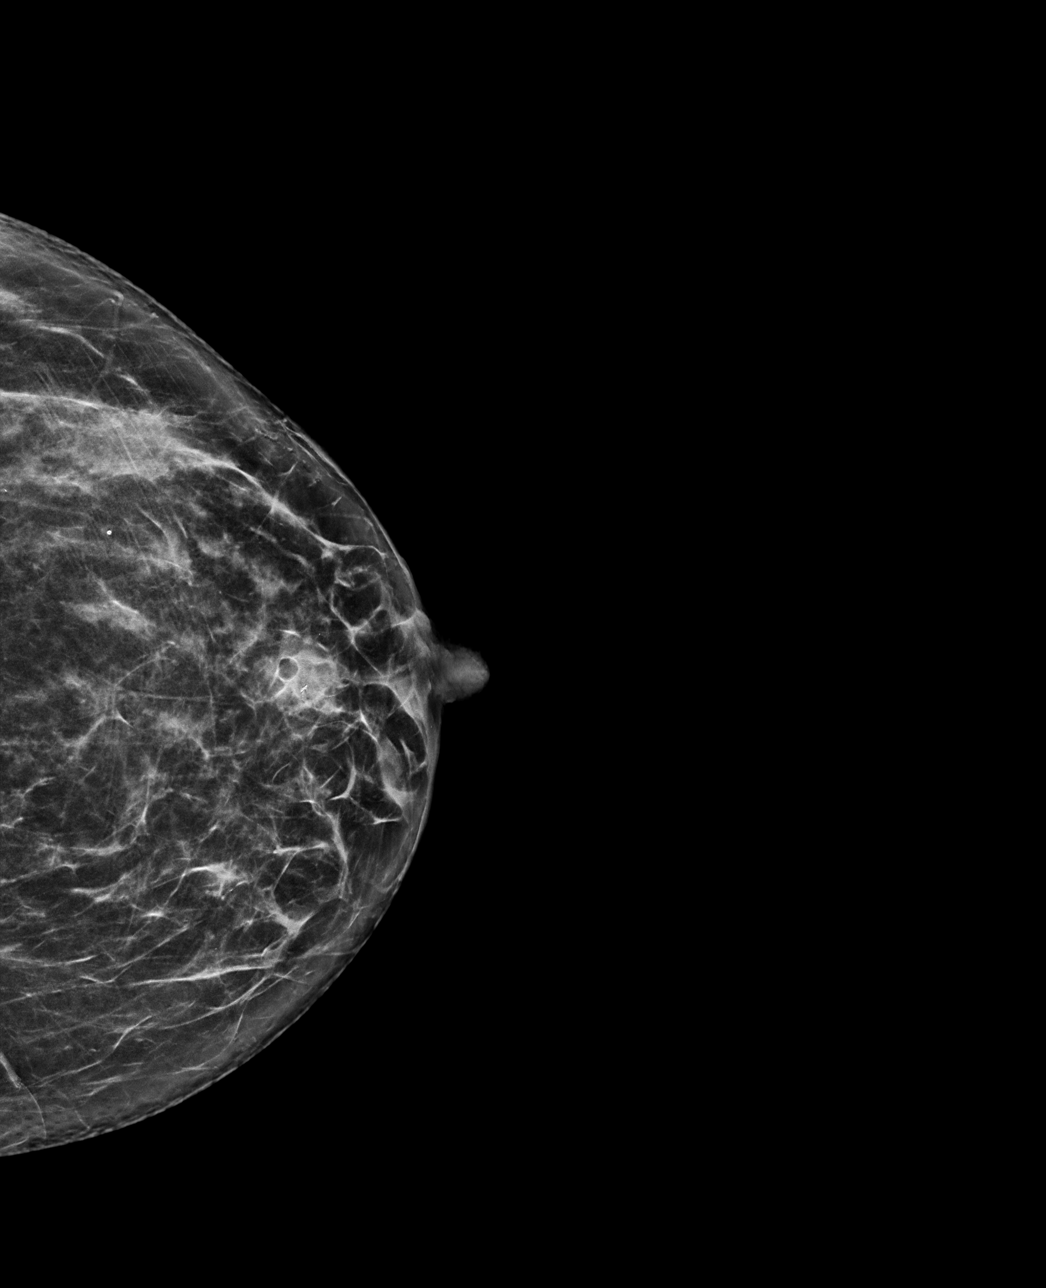

[L ML synth-2D]
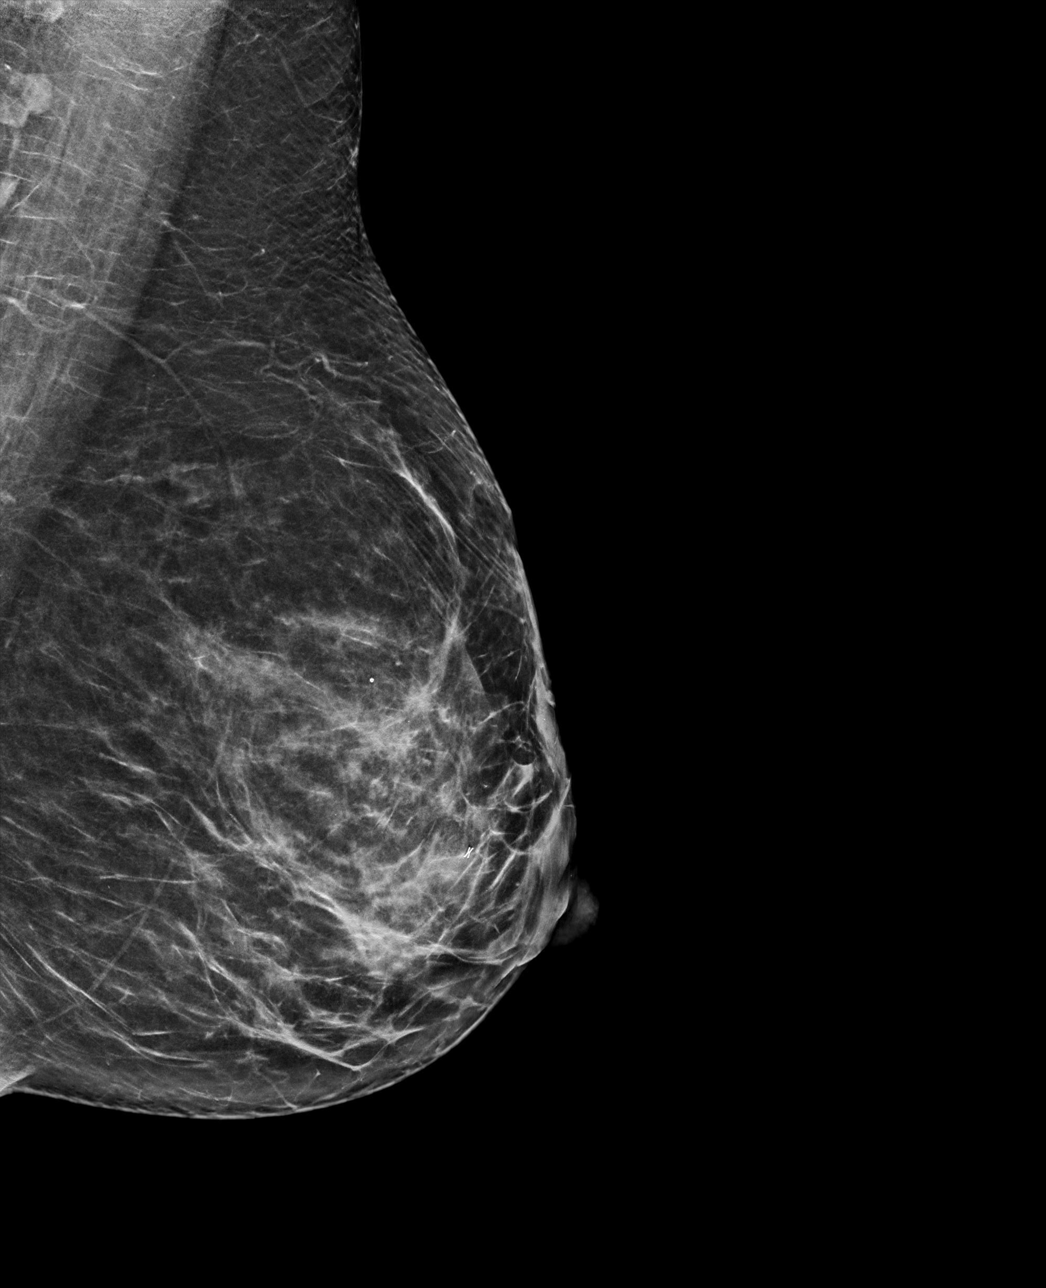

[L ML tomo · tomo slice 38/75.0]
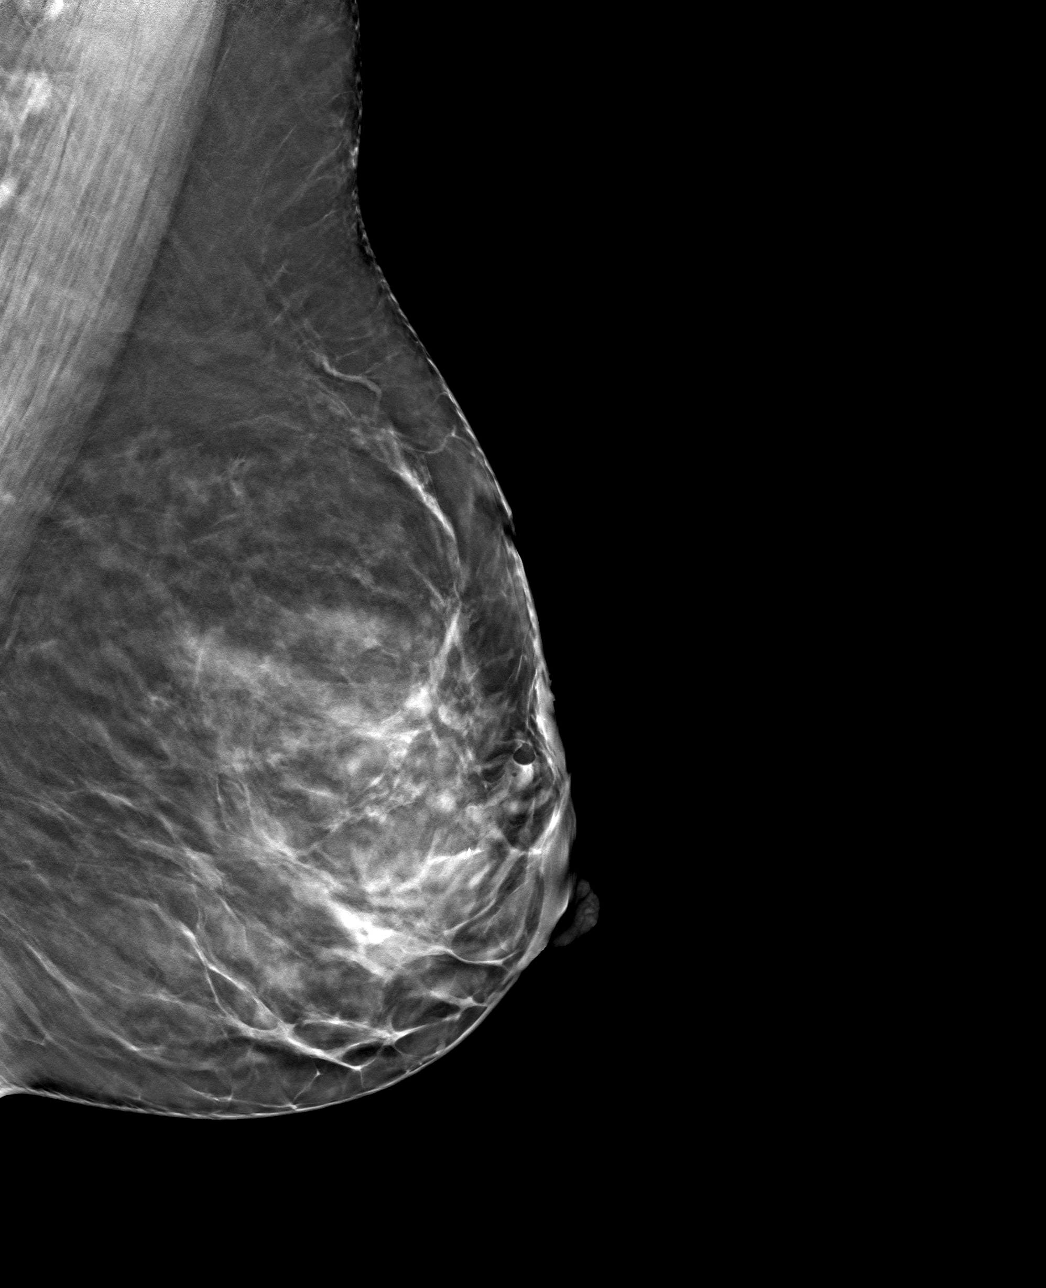

[L CC tomo · tomo slice 33/66.0]
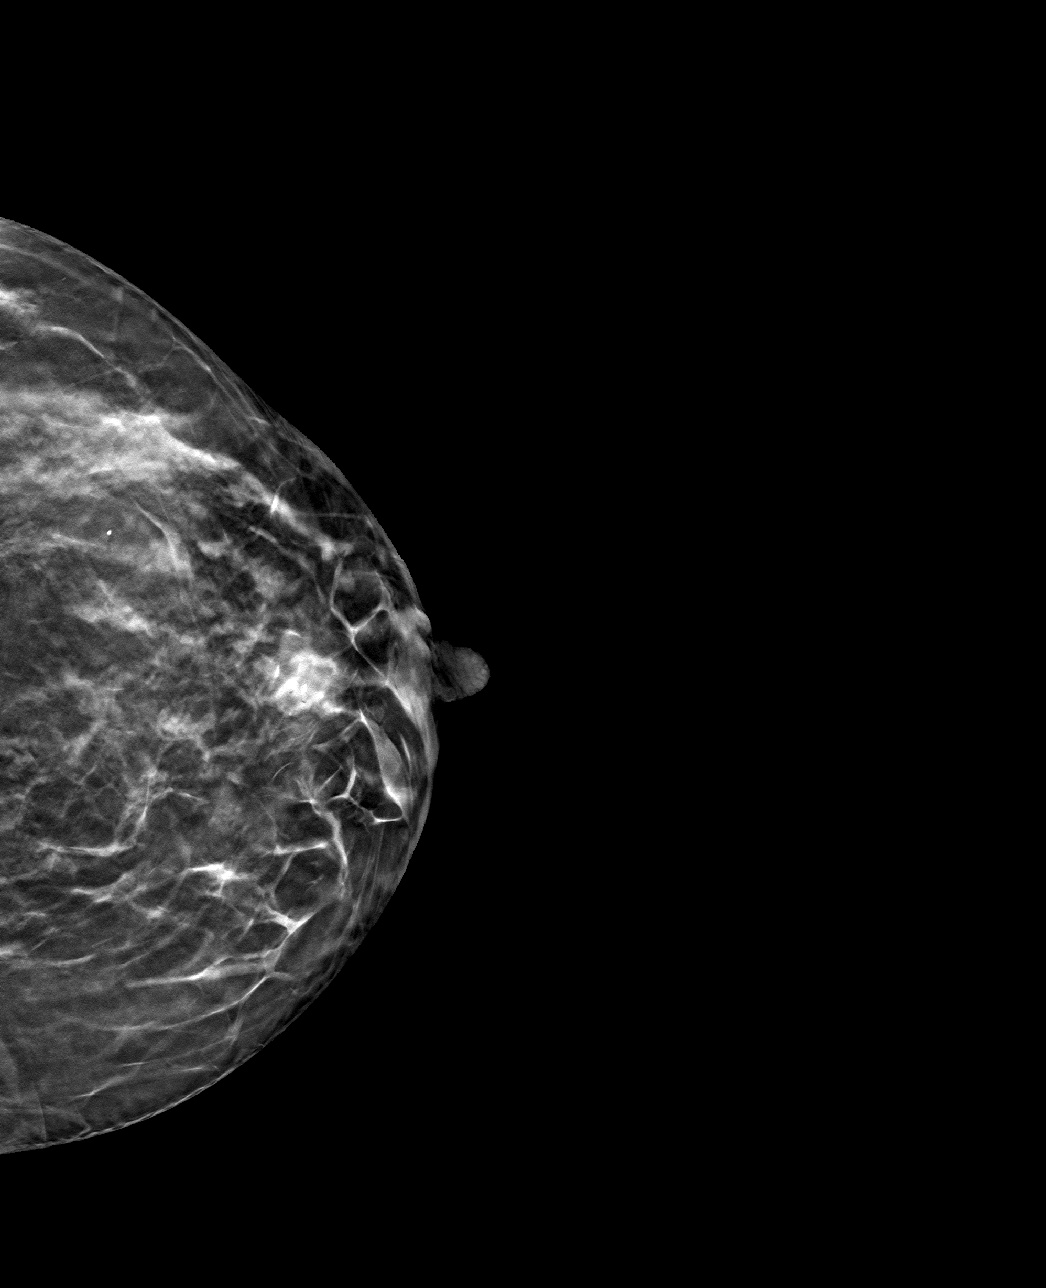

[4 of 12 positions shown; findings below may reference images not displayed]

FINDINGS: 3D Mammographic images were obtained following stereotactic guided
biopsy of breast calcifications. The X shaped biopsy marking clip is
displaced inferiorly by approximately 7 mm.
IMPRESSION: X shaped biopsy marking clip is mildly displaced inferiorly from
site of biopsy.

Final Assessment: Post Procedure Mammograms for Marker Placement

## 2022-10-12 ENCOUNTER — Encounter: Payer: Self-pay | Admitting: Family Medicine

## 2022-10-12 DIAGNOSIS — Z0000 Encounter for general adult medical examination without abnormal findings: Secondary | ICD-10-CM

## 2022-10-12 DIAGNOSIS — R030 Elevated blood-pressure reading, without diagnosis of hypertension: Secondary | ICD-10-CM

## 2022-10-12 DIAGNOSIS — Z131 Encounter for screening for diabetes mellitus: Secondary | ICD-10-CM

## 2022-10-12 DIAGNOSIS — Z833 Family history of diabetes mellitus: Secondary | ICD-10-CM

## 2022-10-12 DIAGNOSIS — E7800 Pure hypercholesterolemia, unspecified: Secondary | ICD-10-CM

## 2022-10-12 DIAGNOSIS — R011 Cardiac murmur, unspecified: Secondary | ICD-10-CM

## 2022-10-17 ENCOUNTER — Encounter: Payer: BC Managed Care – PPO | Admitting: Obstetrics

## 2022-11-01 LAB — COMPREHENSIVE METABOLIC PANEL
ALT: 15 IU/L (ref 0–32)
AST: 17 IU/L (ref 0–40)
Albumin/Globulin Ratio: 1.6 (ref 1.2–2.2)
Albumin: 4.5 g/dL (ref 3.9–4.9)
Alkaline Phosphatase: 88 IU/L (ref 44–121)
BUN/Creatinine Ratio: 12 (ref 9–23)
BUN: 10 mg/dL (ref 6–24)
Bilirubin Total: 0.3 mg/dL (ref 0.0–1.2)
CO2: 22 mmol/L (ref 20–29)
Calcium: 9.7 mg/dL (ref 8.7–10.2)
Chloride: 102 mmol/L (ref 96–106)
Creatinine, Ser: 0.86 mg/dL (ref 0.57–1.00)
Globulin, Total: 2.9 g/dL (ref 1.5–4.5)
Glucose: 91 mg/dL (ref 70–99)
Potassium: 4.6 mmol/L (ref 3.5–5.2)
Sodium: 138 mmol/L (ref 134–144)
Total Protein: 7.4 g/dL (ref 6.0–8.5)
eGFR: 85 mL/min/{1.73_m2} (ref >59)

## 2022-11-01 LAB — CBC WITH DIFFERENTIAL/PLATELET
Basophils Absolute: 0 10*3/uL (ref 0.0–0.2)
Basos: 0 %
EOS (ABSOLUTE): 0.1 10*3/uL (ref 0.0–0.4)
Eos: 1 %
Hematocrit: 42 % (ref 34.0–46.6)
Hemoglobin: 13.6 g/dL (ref 11.1–15.9)
Immature Grans (Abs): 0 10*3/uL (ref 0.0–0.1)
Immature Granulocytes: 0 %
Lymphocytes Absolute: 1.7 10*3/uL (ref 0.7–3.1)
Lymphs: 24 %
MCH: 26.6 pg (ref 26.6–33.0)
MCHC: 32.4 g/dL (ref 31.5–35.7)
MCV: 82 fL (ref 79–97)
Monocytes Absolute: 0.4 10*3/uL (ref 0.1–0.9)
Monocytes: 5 %
Neutrophils Absolute: 4.8 10*3/uL (ref 1.4–7.0)
Neutrophils: 70 %
Platelets: 427 10*3/uL (ref 150–450)
RBC: 5.12 x10E6/uL (ref 3.77–5.28)
RDW: 14.8 % (ref 11.7–15.4)
WBC: 7 10*3/uL (ref 3.4–10.8)

## 2022-11-01 LAB — LIPID PANEL
Chol/HDL Ratio: 3.2 ratio (ref 0.0–4.4)
Cholesterol, Total: 193 mg/dL (ref 100–199)
HDL: 61 mg/dL (ref >39)
LDL Chol Calc (NIH): 111 mg/dL — ABNORMAL HIGH (ref 0–99)
Triglycerides: 118 mg/dL (ref 0–149)
VLDL Cholesterol Cal: 21 mg/dL (ref 5–40)

## 2022-11-01 LAB — TSH: TSH: 1.82 u[IU]/mL (ref 0.450–4.500)

## 2022-11-01 LAB — HEMOGLOBIN A1C
Est. average glucose Bld gHb Est-mCnc: 123 mg/dL
Hgb A1c MFr Bld: 5.9 % — ABNORMAL HIGH (ref 4.8–5.6)

## 2022-11-02 ENCOUNTER — Ambulatory Visit (INDEPENDENT_AMBULATORY_CARE_PROVIDER_SITE_OTHER): Payer: BC Managed Care – PPO | Admitting: Family Medicine

## 2022-11-02 ENCOUNTER — Encounter: Payer: Self-pay | Admitting: Family Medicine

## 2022-11-02 VITALS — BP 136/98 | HR 104 | Ht 66.0 in | Wt 206.2 lb

## 2022-11-02 DIAGNOSIS — Z0000 Encounter for general adult medical examination without abnormal findings: Secondary | ICD-10-CM

## 2022-11-02 DIAGNOSIS — Z8249 Family history of ischemic heart disease and other diseases of the circulatory system: Secondary | ICD-10-CM | POA: Diagnosis not present

## 2022-11-02 DIAGNOSIS — E7800 Pure hypercholesterolemia, unspecified: Secondary | ICD-10-CM

## 2022-11-02 NOTE — Progress Notes (Signed)
Subjective:    Patient ID: Shannon Velazquez, female    DOB: 1975-11-04, 47 y.o.   MRN: [id number]  Shannon Velazquez is a 47 y.o. female presenting on 11/02/2022 for Annual Exam   HPI  Here for Annual Physical and Lab Review.  Elevated A1c Reports big life event stressor situation, she had poor diet. A1c elevated now up to 5.9, previously 5.4 had been controlled for years. Admits poor diet and lifestyle not as active but now has been putting plan into place to correct this with lifestyle  Elevated BP without HYPERTENSION She admits occasional elevated HR at times.  She does not take medication Past readings in other doctors office with variable higher readings Goal to improve her diet and lower caffeine Goal to improve exercise   December 2023 Home BP cuff readings (arm) Initially 140-145 / 88-100 in Dec / Jan with elevation, admits sad life event  Lost her dog, put him down in January  Improved in Feb with avg 133-138/87-97   Heart Murmur Previously documented by PCP, mild      HYPERLIPIDEMIA: - Reports concerns. Last lipid panel 10/2022, mild elevated LDL still with LDL 111, prior 107 to 108. Not on medication Goal to improve lifestyle  The 10-year ASCVD risk score (Arnett DK, et al., 2019) is: 0.7%      Followed Dermatology   Seasonal Environmental Allergies Followed by [facility] [place], she has done the allergy skin testing for environmental triggers. She has variety of triggers. - Treated with meds Levocetirizine, Singulair, Azelastine nasal. Significant improvement.    Health Maintenance:   History of Abnormal Breast Lump / Mammogram Her GYN referred to have diagnostic mammogram and ultrasound, calcification and aspiration biopsy done negative, repeat in 1 year.  Next GYN apt scheduled for 11/12/22 - [facility], will consider updates on Pap Smear / Mammogram  Colon CA Screening - considering either Cologuard or Colonoscopy      11/03/2022    2:29 AM 07/04/2022   10:27 AM 06/01/2020    8:09 AM  Depression screen PHQ 2/9  Decreased Interest 0 0 0  Down, Depressed, Hopeless 0 0 0  PHQ - 2 Score 0 0 0  Altered sleeping   0  Tired, decreased energy   1  Change in appetite   1  Feeling bad or failure about yourself    0  Trouble concentrating   0  Moving slowly or fidgety/restless   0  Suicidal thoughts   0  PHQ-9 Score   2  Difficult doing work/chores   Not difficult at all    Past Medical History:  Diagnosis Date   White coat syndrome with diagnosis of hypertension    Past Surgical History:  Procedure Laterality Date   BREAST BIOPSY Left 11/16/2021   stereo bx-calcs, "X" clip-path pending   CESAREAN SECTION     Social History   Socioeconomic History   Marital status: Married    Spouse name: Not on file   Number of children: Not on file   Years of education: Not on file   Highest education level: Not on file  Occupational History   Not on file  Tobacco Use   Smoking status: Former    Packs/day: 0.25    Years: 10.00    Total pack years: 2.50    Types: Cigarettes    Quit date: 08/27/2008    Years since quitting: 14.1   Smokeless tobacco: Never  Vaping Use   Vaping Use: Never used  Substance and Sexual Activity   Alcohol use: Yes    Comment: very rarely   Drug use: Never   Sexual activity: Yes    Birth control/protection: Surgical    Comment: husband - vasectomy  Other Topics Concern   Not on file  Social History Narrative   Not on file   Social Determinants of Health   Financial Resource Strain: Not on file  Food Insecurity: Not on file  Transportation Needs: Not on file  Physical Activity: Not on file  Stress: Not on file  Social Connections: Not on file  Intimate Partner Violence: Not on file   Family History  Problem Relation Age of Onset   Hypertension Father    Heart disease Paternal Grandmother        CABG   Diabetes Paternal Grandmother    Coronary artery disease  Paternal Grandmother    Hypertension Paternal Grandfather    Hyperlipidemia Paternal Grandfather    Diabetes Paternal Grandfather    Heart disease Paternal Grandfather    Heart attack Paternal Grandfather    Breast cancer Paternal Aunt 40   Breast cancer Other        maternal grandmother's sister   Current Outpatient Medications on File Prior to Visit  Medication Sig   Azelastine HCl 137 MCG/SPRAY SOLN Place into both nostrils.   levocetirizine (XYZAL ALLERGY 24HR) 5 MG tablet    montelukast (SINGULAIR) 10 MG tablet Take 10 mg by mouth at bedtime.   No current facility-administered medications on file prior to visit.    Review of Systems Per HPI unless specifically indicated above      Objective:    BP (!) 136/98 (BP Location: Left Arm, Cuff Size: Normal)   Pulse (!) 104   Ht 5\' 6"  (1.676 m)   Wt 206 lb 3.2 oz (93.5 kg)   SpO2 98%   BMI 33.28 kg/m   Wt Readings from Last 3 Encounters:  11/02/22 206 lb 3.2 oz (93.5 kg)  07/04/22 205 lb (93 kg)  10/13/21 204 lb 8 oz (92.8 kg)    Physical Exam Vitals and nursing note reviewed.  Constitutional:      General: She is not in acute distress.    Appearance: She is well-developed. She is not diaphoretic.     Comments: Well-appearing, comfortable, cooperative  HENT:     Head: Normocephalic and atraumatic.  Eyes:     General:        Right eye: No discharge.        Left eye: No discharge.     Conjunctiva/sclera: Conjunctivae normal.     Pupils: Pupils are equal, round, and reactive to light.  Neck:     Thyroid: No thyromegaly.  Cardiovascular:     Rate and Rhythm: Normal rate and regular rhythm.     Pulses: Normal pulses.     Heart sounds: Normal heart sounds. No murmur heard. Pulmonary:     Effort: Pulmonary effort is normal. No respiratory distress.     Breath sounds: Normal breath sounds. No wheezing or rales.  Abdominal:     General: Bowel sounds are normal. There is no distension.     Palpations: Abdomen is  soft. There is no mass.     Tenderness: There is no abdominal tenderness.  Musculoskeletal:        General: No tenderness. Normal range of motion.     Cervical back: Normal range of motion and neck supple.     Comments: Upper / Lower Extremities: -  Normal muscle tone, strength bilateral upper extremities 5/5, lower extremities 5/5  Lymphadenopathy:     Cervical: No cervical adenopathy.  Skin:    General: Skin is warm and dry.     Findings: No erythema or rash.  Neurological:     Mental Status: She is alert and oriented to person, place, and time.     Comments: Distal sensation intact to light touch all extremities  Psychiatric:        Mood and Affect: Mood normal.        Behavior: Behavior normal.        Thought Content: Thought content normal.     Comments: Well groomed, good eye contact, normal speech and thoughts      Results for orders placed or performed in visit on 10/12/22  CBC with Differential/Platelet  Result Value Ref Range   WBC 7.0 3.4 - 10.8 x10E3/uL   RBC 5.12 3.77 - 5.28 x10E6/uL   Hemoglobin 13.6 11.1 - 15.9 g/dL   Hematocrit 40.9 81.1 - 46.6 %   MCV 82 79 - 97 fL   MCH 26.6 26.6 - 33.0 pg   MCHC 32.4 31.5 - 35.7 g/dL   RDW 91.4 78.2 - 95.6 %   Platelets 427 150 - 450 x10E3/uL   Neutrophils 70 Not Estab. %   Lymphs 24 Not Estab. %   Monocytes 5 Not Estab. %   Eos 1 Not Estab. %   Basos 0 Not Estab. %   Neutrophils Absolute 4.8 1.4 - 7.0 x10E3/uL   Lymphocytes Absolute 1.7 0.7 - 3.1 x10E3/uL   Monocytes Absolute 0.4 0.1 - 0.9 x10E3/uL   EOS (ABSOLUTE) 0.1 0.0 - 0.4 x10E3/uL   Basophils Absolute 0.0 0.0 - 0.2 x10E3/uL   Immature Granulocytes 0 Not Estab. %   Immature Grans (Abs) 0.0 0.0 - 0.1 x10E3/uL  Lipid panel  Result Value Ref Range   Cholesterol, Total 193 100 - 199 mg/dL   Triglycerides 213 0 - 149 mg/dL   HDL 61 >08 mg/dL   VLDL Cholesterol Cal 21 5 - 40 mg/dL   LDL Chol Calc (NIH) 657 (H) 0 - 99 mg/dL   Chol/HDL Ratio 3.2 0.0 - 4.4 ratio   Hemoglobin A1c  Result Value Ref Range   Hgb A1c MFr Bld 5.9 (H) 4.8 - 5.6 %   Est. average glucose Bld gHb Est-mCnc 123 mg/dL  Comprehensive metabolic panel  Result Value Ref Range   Glucose 91 70 - 99 mg/dL   BUN 10 6 - 24 mg/dL   Creatinine, Ser 8.46 0.57 - 1.00 mg/dL   eGFR 85 >96 EX/BMW/4.13   BUN/Creatinine Ratio 12 9 - 23   Sodium 138 134 - 144 mmol/L   Potassium 4.6 3.5 - 5.2 mmol/L   Chloride 102 96 - 106 mmol/L   CO2 22 20 - 29 mmol/L   Calcium 9.7 8.7 - 10.2 mg/dL   Total Protein 7.4 6.0 - 8.5 g/dL   Albumin 4.5 3.9 - 4.9 g/dL   Globulin, Total 2.9 1.5 - 4.5 g/dL   Albumin/Globulin Ratio 1.6 1.2 - 2.2   Bilirubin Total 0.3 0.0 - 1.2 mg/dL   Alkaline Phosphatase 88 44 - 121 IU/L   AST 17 0 - 40 IU/L   ALT 15 0 - 32 IU/L  TSH  Result Value Ref Range   TSH 1.820 0.450 - 4.500 uIU/mL      Assessment & Plan:   Problem List Items Addressed This Visit   None Visit  Diagnoses     Annual physical exam    -  Primary       Updated Health Maintenance information Reviewed recent lab results with patient Encouraged improvement to lifestyle with diet and exercise Goal of weight loss  Coronary Calcium CT ordered given strong fam history heart disease age 32-50 range  Elevated A1c Goal for improve lifestyle Repeat A1c next time  Elevated BP without HYPERTENSION Discussion that review of all BP at home and in office, ultimately she is in range of HYPERTENSION, we reviewed goals and ranges. Never on treatment prior Discussed several modifiable risk factors Goal BP < 135/85 If BP still elevated at next visit will discuss rx medication options and start therapy w new diagnosis  No orders of the defined types were placed in this encounter.     Follow up plan: Return in about 4 months (around 03/04/2023) for 4 month follow-up BP and Elevated Sugar check A1c.  Follow-up 4 month Elevated A1c + HTN   Saralyn Pilar, DO [facility] 11/02/2022, 9:26 AM

## 2022-11-02 NOTE — Patient Instructions (Addendum)
Thank you for coming to the office today.  Repeat A1c at next visit fingerstick.  Coronary Calcium CT Score will be ordered, stay tuned for scheduling  ---------------  For blood pressure  Goal < 135 / 85  Top 3 medications  Amlodipine - calcium channel blocker, very effective especially at controlling the lower number diastolic, once daily dose, 5 to '10mg'$ , low dose, safe easy to take and limited side effects. Main side effect potential is leg swelling, usually more often on the higher dose '10mg'$ , the swelling is benign or not harmful but it can be bothersome  2.  ARB medications - Losartan, Olmesartan, Valsartan - this category works on the kidney, once daily easy to take safe effective BP med. We would need to double check kidney and potassium blood work after you start this med. Rare rare side effect with Angioedema - face lip tongue throat swelling, allergic reaction, stop med and seek help if trouble breathing, can happen days weeks months years.  3.  Fluid Pill Diuretic Hydrochlorothiazide - once daily dosing very effective, but can cause issues with fluid loss, frequent urination, lose sodium potassium and electrolytes.    Colon Cancer Screening: - For all adults age 27+ routine colon cancer screening is highly recommended.     - Recent guidelines from [place] recommend starting age of 54 - Early detection of colon cancer is important, because often there are no warning signs or symptoms, also if found early usually it can be cured. Late stage is hard to treat.  - If you are not interested in Colonoscopy screening (if done and normal you could be cleared for 5 to 10 years until next due), then Cologuard is an excellent alternative for screening test for Colon Cancer. It is highly sensitive for detecting DNA of colon cancer from even the earliest stages. Also, there is NO bowel prep required. - If Cologuard is NEGATIVE, then it is good for 3 years before next due -  If Cologuard is POSITIVE, then it is strongly advised to get a Colonoscopy, which allows the GI doctor to locate the source of the cancer or polyp (even very early stage) and treat it by removing it. ------------------------- If you would like to proceed with Cologuard (stool DNA test) - FIRST, call your insurance company and tell them you want to check cost of Cologuard tell them CPT Code [id number] (it may be completely covered and you could get for no cost, OR max cost without any coverage is about $600). Also, keep in mind if you do NOT open the kit, and decide not to do the test, you will NOT be charged, you should contact the company if you decide not to do the test. - If you want to proceed, you can notify us (phone message, [place], or at next visit) and we will order it for you. The test kit will be delivered to you house within about 1 week. Follow instructions to collect sample, you may call the company for any help or questions, 24/7 telephone support at [phone].    Please schedule a Follow-up Appointment to: Return in about 4 months (around 03/04/2023) for 4 month follow-up BP and Elevated Sugar check A1c.  If you have any other questions or concerns, please feel free to call the office or send a message through [place]. You may also schedule an earlier appointment if necessary.  Additionally, you may be receiving a survey about your experience at our office within a few  days to 1 week by e-mail or mail. We value your feedback.  Regnald Bowens, DO [facility], [facility] 

## 2022-11-05 ENCOUNTER — Ambulatory Visit
Admission: RE | Admit: 2022-11-05 | Discharge: 2022-11-05 | Disposition: A | Discharge status: Home / Self Care | Payer: BC Managed Care – PPO | Source: Ambulatory Visit | Attending: Family Medicine | Admitting: Family Medicine

## 2022-11-05 DIAGNOSIS — Z8249 Family history of ischemic heart disease and other diseases of the circulatory system: Secondary | ICD-10-CM

## 2022-11-05 DIAGNOSIS — E7800 Pure hypercholesterolemia, unspecified: Secondary | ICD-10-CM | POA: Insufficient documentation

## 2022-11-12 ENCOUNTER — Other Ambulatory Visit (HOSPITAL_COMMUNITY)
Admission: RE | Admit: 2022-11-12 | Discharge: 2022-11-12 | Disposition: A | Discharge status: Home / Self Care | Payer: BC Managed Care – PPO | Source: Ambulatory Visit | Attending: Obstetrics | Admitting: Obstetrics

## 2022-11-12 ENCOUNTER — Encounter: Payer: Self-pay | Admitting: Advanced Practice Midwife

## 2022-11-12 ENCOUNTER — Ambulatory Visit (INDEPENDENT_AMBULATORY_CARE_PROVIDER_SITE_OTHER): Payer: BC Managed Care – PPO | Admitting: Advanced Practice Midwife

## 2022-11-12 VITALS — BP 150/90 | HR 94 | Ht 66.0 in | Wt 203.0 lb

## 2022-11-12 DIAGNOSIS — Z124 Encounter for screening for malignant neoplasm of cervix: Secondary | ICD-10-CM | POA: Insufficient documentation

## 2022-11-12 DIAGNOSIS — Z1239 Encounter for other screening for malignant neoplasm of breast: Secondary | ICD-10-CM

## 2022-11-12 DIAGNOSIS — Z01419 Encounter for gynecological examination (general) (routine) without abnormal findings: Secondary | ICD-10-CM

## 2022-11-12 NOTE — Patient Instructions (Signed)
Mediterranean Diet ?A Mediterranean diet refers to food and lifestyle choices that are based on the traditions of countries located on the [place] Sea. It focuses on eating more fruits, vegetables, whole grains, beans, nuts, seeds, and heart-healthy fats, and eating less dairy, meat, eggs, and processed foods with added sugar, salt, and fat. This way of eating has been shown to help prevent certain conditions and improve outcomes for people who have chronic diseases, like kidney disease and heart disease. ?What are tips for following this plan? ?Reading food labels ?Check the serving size of packaged foods. For foods such as rice and pasta, the serving size refers to the amount of cooked product, not dry. ?Check the total fat in packaged foods. Avoid foods that have saturated fat or trans fats. ?Check the ingredient list for added sugars, such as corn syrup. ?Shopping ? ?Buy a variety of foods that offer a balanced diet, including: ?Fresh fruits and vegetables (produce). ?Grains, beans, nuts, and seeds. Some of these may be available in unpackaged forms or large amounts (in bulk). ?Fresh seafood. ?Poultry and eggs. ?Low-fat dairy products. ?Buy whole ingredients instead of prepackaged foods. ?Buy fresh fruits and vegetables in-season from local farmers markets. ?Buy plain frozen fruits and vegetables. ?If you do not have access to quality fresh seafood, buy precooked frozen shrimp or canned fish, such as tuna, salmon, or sardines. ?Stock your pantry so you always have certain foods on hand, such as olive oil, canned tuna, canned tomatoes, rice, pasta, and beans. ?Cooking ?Cook foods with extra-virgin olive oil instead of using butter or other vegetable oils. ?Have meat as a side dish, and have vegetables or grains as your main dish. This means having meat in small portions or adding small amounts of meat to foods like pasta or stew. ?Use beans or vegetables instead of meat in common dishes like chili or  lasagna. ?Experiment with different cooking methods. Try roasting, broiling, steaming, and saut?ing vegetables. ?Add frozen vegetables to soups, stews, pasta, or rice. ?Add nuts or seeds for added healthy fats and plant protein at each meal. You can add these to yogurt, salads, or vegetable dishes. ?Marinate fish or vegetables using olive oil, lemon juice, garlic, and fresh herbs. ?Meal planning ?Plan to eat one vegetarian meal one day each week. Try to work up to two vegetarian meals, if possible. ?Eat seafood two or more times a week. ?Have healthy snacks readily available, such as: ?Vegetable sticks with hummus. ?Greek yogurt. ?Fruit and nut trail mix. ?Eat balanced meals throughout the week. This includes: ?Fruit: 2-3 servings a day. ?Vegetables: 4-5 servings a day. ?Low-fat dairy: 2 servings a day. ?Fish, poultry, or lean meat: 1 serving a day. ?Beans and legumes: 2 or more servings a week. ?Nuts and seeds: 1-2 servings a day. ?Whole grains: 6-8 servings a day. ?Extra-virgin olive oil: 3-4 servings a day. ?Limit red meat and sweets to only a few servings a month. ?Lifestyle ? ?Cook and eat meals together with your family, when possible. ?Drink enough fluid to keep your urine pale yellow. ?Be physically active every day. This includes: ?Aerobic exercise like running or swimming. ?Leisure activities like gardening, walking, or housework. ?Get 7-8 hours of sleep each night. ?If recommended by your health care provider, drink red wine in moderation. This means 1 glass a day for nonpregnant women and 2 glasses a day for men. A glass of wine equals 5 oz (150 mL). ?What foods should I eat? ?Fruits ?Apples. Apricots. Avocado. Berries. Bananas. Cherries. Dates.   Figs. Grapes. Lemons. Melon. Oranges. Peaches. Plums. Pomegranate. ?Vegetables ?Artichokes. Beets. Broccoli. Cabbage. Carrots. Eggplant. Green beans. Chard. Kale. Spinach. Onions. Leeks. Peas. Squash. Tomatoes. Peppers. Radishes. ?Grains ?Whole-grain pasta. Brown  rice. Bulgur wheat. Polenta. Couscous. Whole-wheat bread. Oatmeal. Quinoa. ?Meats and other proteins ?Beans. Almonds. Sunflower seeds. Pine nuts. Peanuts. Cod. Salmon. Scallops. Shrimp. Tuna. Tilapia. Clams. Oysters. Eggs. Poultry without skin. ?Dairy ?Low-fat milk. Cheese. Greek yogurt. ?Fats and oils ?Extra-virgin olive oil. Avocado oil. Grapeseed oil. ?Beverages ?Water. Red wine. Herbal tea. ?Sweets and desserts ?Greek yogurt with honey. Baked apples. Poached pears. Trail mix. ?Seasonings and condiments ?Basil. Cilantro. Coriander. Cumin. Mint. Parsley. Sage. Rosemary. Tarragon. Garlic. Oregano. Thyme. Pepper. Balsamic vinegar. Tahini. Hummus. Tomato sauce. Olives. Mushrooms. ?The items listed above may not be a complete list of foods and beverages you can eat. Contact a dietitian for more information. ?What foods should I limit? ?This is a list of foods that should be eaten rarely or only on special occasions. ?Fruits ?Fruit canned in syrup. ?Vegetables ?Deep-fried potatoes (french fries). ?Grains ?Prepackaged pasta or rice dishes. Prepackaged cereal with added sugar. Prepackaged snacks with added sugar. ?Meats and other proteins ?Beef. Pork. Lamb. Poultry with skin. Hot dogs. Bacon. ?Dairy ?Ice cream. Sour cream. Whole milk. ?Fats and oils ?Butter. Canola oil. Vegetable oil. Beef fat (tallow). Lard. ?Beverages ?Juice. Sugar-sweetened soft drinks. Beer. Liquor and spirits. ?Sweets and desserts ?Cookies. Cakes. Pies. Candy. ?Seasonings and condiments ?Mayonnaise. Pre-made sauces and marinades. ?The items listed above may not be a complete list of foods and beverages you should limit. Contact a dietitian for more information. ?Summary ?The Mediterranean diet includes both food and lifestyle choices. ?Eat a variety of fresh fruits and vegetables, beans, nuts, seeds, and whole grains. ?Limit the amount of red meat and sweets that you eat. ?If recommended by your health care provider, drink red wine in moderation.  This means 1 glass a day for nonpregnant women and 2 glasses a day for men. A glass of wine equals 5 oz (150 mL). ?This information is not intended to replace advice given to you by your health care provider. Make sure you discuss any questions you have with your health care provider. ?Document Revised: 09/18/2019 Document Reviewed: 07/16/2019 ?Elsevier Patient Education ? 2023 [employer] ? ?

## 2022-11-12 NOTE — Progress Notes (Signed)
[place]  Gynecology Annual Exam  PCP: Olin Hauser, DO  Chief Complaint:  Chief Complaint  Patient presents with   Gynecologic Exam    History of Present Illness: Patient is a 47 y.o. G2P2 presents for annual exam. The patient mentions today that in the past 6 months her periods occur every 2-3 weeks. They last 6-7 days. There are 2 heavy days. We discussed likely peri-menopausal changes. She declines hormonal regulation of cycles as she feels more steady emotionally currently without exogenous hormones.   LMP: Patient's last menstrual period was 11/05/2022 (approximate).  Intermenstrual Bleeding: in between most recent periods Postcoital Bleeding: no Dysmenorrhea: no   The patient is sexually active. She currently uses vasectomy for contraception. She denies dyspareunia.  The patient does perform self breast exams.  There is no notable family history of breast or ovarian cancer in her family.  The patient wears seatbelts: yes.   The patient has regular exercise:  she has recently made changes in her diet and exercise to improve healthy lifestyle. She admits adequate sleep .    The patient denies current symptoms of depression.    Review of Systems: Review of Systems  Constitutional:  Negative for chills and fever.  HENT:  Negative for congestion, ear discharge, ear pain, hearing loss, sinus pain and sore throat.   Eyes:  Negative for blurred vision and double vision.  Respiratory:  Negative for cough, shortness of breath and wheezing.   Cardiovascular:  Negative for chest pain, palpitations and leg swelling.  Gastrointestinal:  Negative for abdominal pain, blood in stool, constipation, diarrhea, heartburn, melena, nausea and vomiting.  Genitourinary:  Negative for dysuria, flank pain, frequency, hematuria and urgency.  Musculoskeletal:  Negative for back pain, joint pain and myalgias.  Skin:  Negative for itching and rash.  Neurological:  Negative for  dizziness, tingling, tremors, sensory change, speech change, focal weakness, seizures, loss of consciousness, weakness and headaches.  Endo/Heme/Allergies:  Negative for environmental allergies. Does not bruise/bleed easily.       Positive for increased frequency of menstrual cycles  Psychiatric/Behavioral:  Negative for depression, hallucinations, memory loss, substance abuse and suicidal ideas. The patient is not nervous/anxious and does not have insomnia.     Past Medical History:  Patient Active Problem List   Diagnosis Date Noted   Environmental and seasonal allergies 07/04/2022   Pure hypercholesterolemia 07/04/2022   Heart murmur 07/04/2022   Elevated BP without diagnosis of hypertension 07/04/2022    Past Surgical History:  Past Surgical History:  Procedure Laterality Date   BREAST BIOPSY Left 11/16/2021   stereo bx-calcs, "X" clip-path pending   CESAREAN SECTION      Gynecologic History:  Patient's last menstrual period was 11/05/2022 (approximate). Contraception: vasectomy Last Pap: 2019 Results were:  no abnormalities  Last mammogram: 2023 Results were: BI-RADS IV/ benign calcifications  Obstetric History: G2P2  Family History:  Family History  Problem Relation Age of Onset   Hypertension Father    Heart disease Paternal Grandmother        CABG   Diabetes Paternal Grandmother    Coronary artery disease Paternal Grandmother    Hypertension Paternal Grandfather    Hyperlipidemia Paternal Grandfather    Diabetes Paternal Grandfather    Heart disease Paternal Grandfather    Heart attack Paternal Grandfather    Breast cancer Paternal Aunt 85   Breast cancer Other        maternal grandmother's sister    Social History:  Social  History   Socioeconomic History   Marital status: Married    Spouse name: Not on file   Number of children: Not on file   Years of education: Not on file   Highest education level: Not on file  Occupational History   Not on file   Tobacco Use   Smoking status: Former    Packs/day: 0.25    Years: 10.00    Additional pack years: 0.00    Total pack years: 2.50    Types: Cigarettes    Quit date: 08/27/2008    Years since quitting: 14.2   Smokeless tobacco: Never  Vaping Use   Vaping Use: Never used  Substance and Sexual Activity   Alcohol use: Not Currently    Comment: very rarely   Drug use: Never   Sexual activity: Yes    Birth control/protection: Surgical    Comment: husband - vasectomy  Other Topics Concern   Not on file  Social History Narrative   Not on file   Social Determinants of Health   Financial Resource Strain: Not on file  Food Insecurity: Not on file  Transportation Needs: Not on file  Physical Activity: Not on file  Stress: Not on file  Social Connections: Not on file  Intimate Partner Violence: Not on file    Allergies:  Allergies  Allergen Reactions   Thimerosal (Thiomersal) Hives    Medications: Prior to Admission medications   Medication Sig Start Date End Date Taking? Authorizing Provider  Azelastine HCl 137 MCG/SPRAY SOLN Place into both nostrils. 05/21/22  Yes [provider]  levocetirizine (XYZAL ALLERGY 24HR) 5 MG tablet  01/25/22  Yes [provider]  montelukast (SINGULAIR) 10 MG tablet Take 10 mg by mouth at bedtime. 05/21/22  Yes [provider]    Physical Exam Vitals: Blood pressure (!) 150/90, pulse 94, height 5\' 6"  (1.676 m), weight 203 lb (92.1 kg), last menstrual period 11/05/2022.  General: NAD HEENT: normocephalic, anicteric Thyroid: no enlargement, no palpable nodules Pulmonary: No increased work of breathing, CTAB Cardiovascular: RRR, distal pulses 2+ Breast: Breast symmetrical, no tenderness, no palpable nodules or masses, no skin or nipple retraction present, no nipple discharge.  No axillary or supraclavicular lymphadenopathy. Abdomen: NABS, soft, non-tender, non-distended.  Umbilicus without lesions.  No hepatomegaly,  splenomegaly or masses palpable. No evidence of hernia  Genitourinary:  External: Normal external female genitalia.  Normal urethral meatus, normal Bartholin's and Skene's glands.    Vagina: Normal vaginal mucosa, no evidence of prolapse.    Cervix: Grossly normal in appearance, no bleeding  Uterus: Non-enlarged, mobile, normal contour.  No CMT  Adnexa: ovaries non-enlarged, no adnexal masses  Rectal: deferred  Lymphatic: no evidence of inguinal lymphadenopathy Extremities: no edema, erythema, or tenderness Neurologic: Grossly intact Psychiatric: mood appropriate, affect full   Assessment: 47 y.o. G2P2 routine annual exam  Plan: Problem List Items Addressed This Visit   None Visit Diagnoses     Screening for cervical cancer    -  Primary   Relevant Orders   Cytology - PAP   Breast screening       Relevant Orders   MM 3D SCREENING MAMMOGRAM BILATERAL BREAST       1) Mammogram - recommend yearly screening mammogram.  Mammogram Was ordered today  2) STI screening  was offered and declined  3) ASCCP guidelines and rationale discussed.  Patient opts for every 5 years screening interval  4) Contraception - the patient is currently using  vasectomy.  She is happy with her current form of contraception and plans to continue  5) Colonoscopy -- Screening recommended starting at age 21 for average risk individuals, age 39 for individuals deemed at increased risk (including African Americans) and recommended to continue until age 17.  For patient age 50-85 individualized approach is recommended.  Gold standard screening is via colonoscopy, Cologuard screening is an acceptable alternative for patient unwilling or unable to undergo colonoscopy.  "Colorectal cancer screening for average?risk adults: 2018 guideline update from the American Cancer Society"CA: A Cancer Journal for Clinicians: Jan 23, 2017   6) Routine healthcare maintenance including cholesterol, diabetes screening discussed  managed by PCP  7) Return in about 1 year (around 11/12/2023) for annual established gyn.   Rod Can, [facility] 11/12/2022, 2:30 PM

## 2022-11-15 LAB — CYTOLOGY - PAP
Comment: NEGATIVE
Diagnosis: NEGATIVE
High risk HPV: NEGATIVE

## 2022-11-20 ENCOUNTER — Ambulatory Visit: Payer: BC Managed Care – PPO | Admitting: Family Medicine

## 2022-11-22 ENCOUNTER — Encounter: Payer: Self-pay | Admitting: Family Medicine

## 2022-11-22 ENCOUNTER — Other Ambulatory Visit: Payer: Self-pay

## 2022-11-22 DIAGNOSIS — Z1211 Encounter for screening for malignant neoplasm of colon: Secondary | ICD-10-CM

## 2022-12-06 ENCOUNTER — Ambulatory Visit
Admission: RE | Admit: 2022-12-06 | Discharge: 2022-12-06 | Disposition: A | Discharge status: Home / Self Care | Payer: BC Managed Care – PPO | Source: Ambulatory Visit | Attending: Advanced Practice Midwife | Admitting: Advanced Practice Midwife

## 2022-12-06 DIAGNOSIS — Z1231 Encounter for screening mammogram for malignant neoplasm of breast: Secondary | ICD-10-CM | POA: Insufficient documentation

## 2022-12-06 DIAGNOSIS — Z1239 Encounter for other screening for malignant neoplasm of breast: Secondary | ICD-10-CM

## 2022-12-26 LAB — COLOGUARD: COLOGUARD: NEGATIVE

## 2023-03-02 ENCOUNTER — Encounter: Payer: Self-pay | Admitting: Family Medicine

## 2023-03-02 DIAGNOSIS — R7309 Other abnormal glucose: Secondary | ICD-10-CM

## 2023-03-07 ENCOUNTER — Ambulatory Visit (INDEPENDENT_AMBULATORY_CARE_PROVIDER_SITE_OTHER): Payer: BC Managed Care – PPO | Admitting: Family Medicine

## 2023-03-07 VITALS — BP 134/88 | HR 102 | Temp 99.0°F | Resp 17 | Ht 66.0 in | Wt 202.2 lb

## 2023-03-07 DIAGNOSIS — R7309 Other abnormal glucose: Secondary | ICD-10-CM | POA: Diagnosis not present

## 2023-03-07 DIAGNOSIS — R590 Localized enlarged lymph nodes: Secondary | ICD-10-CM

## 2023-03-07 DIAGNOSIS — R030 Elevated blood-pressure reading, without diagnosis of hypertension: Secondary | ICD-10-CM | POA: Diagnosis not present

## 2023-03-07 LAB — HEMOGLOBIN A1C
Est. average glucose Bld gHb Est-mCnc: 114 mg/dL
Hgb A1c MFr Bld: 5.6 % (ref 4.8–5.6)

## 2023-03-07 NOTE — Patient Instructions (Addendum)
Thank you for coming to the office today.  BP is improved on your home readings, agree that this is likely white coat hypertension  Consider sleep apnea at this time.  Defer blood pressure medication  Recent Labs    10/31/22 0819 03/06/23 1020  HGBA1C 5.9* 5.6   Great work on diet and lifestyle keep improving. Add exercise as discussed!  ----  Finger stick POC A1c test in office. Next time  -----------------   Stay tuned  For R Breast / Axillary arm ultrasound  They will call to schedule - you MAY need a diagnostic mammogram as well  [facility] at [facility] [address], [address] [place], [place] [address] Phone: [phone]   Please schedule a Follow-up Appointment to: Return in about 6 months (around 09/07/2023) for 6 month Elevated A1c Fingerstick, Elevated BP updates.  If you have any other questions or concerns, please feel free to call the office or send a message through MyChart. You may also schedule an earlier appointment if necessary.  Additionally, you may be receiving a survey about your experience at our office within a few days to 1 week by e-mail or mail. We value your feedback.  Saralyn Pilar, DO [facility], [place]

## 2023-03-07 NOTE — Progress Notes (Signed)
Subjective:    Patient ID: Shannon Velazquez, female    DOB: November 29, 1975, 47 y.o.   MRN: [id number]  Shannon Velazquez is a 47 y.o. female presenting on 03/07/2023 for Hypertension, Elevated A1C (Pt her to follow on A1C results ), and Cyst (Pt complains of cyst under her Rt armpit that subsided and return. X 2 yrs )   HPI  Elevated A1c Updates since last visit 10/2022, she has improved her lifestyle diet. She admits not as active or exercise as she wished, and will work on home gym soon. Diet improvement, avoiding high sugar fruit intake. She is adding low sugar options as well, [place] Yogurt. She is reducing Vietnamese coffee (high in sugar) Last result yesterday 7/10 - A1c 5.6 (prior range 5.9, 10/2022)  Elevated BP without HYPERTENSION Chronic problem, prior readings in office elevated. She admits occasional elevated HR at times.  She does not take medication Past readings in other doctors office with variable higher readings Goal to improve her diet and lower caffeine Still goal to improve exercise Home BP readings reviewed today for past several months overall improved SBP avg 127 / 85-87 range   Home BP readings 139/85, 84 132/88, 83 127/'86, 87  120/86, 84 121/86, 86  127/83, 82 123/83, 85 124/81, 84 130/88, 87  Does not endorse daytime sleepiness, sleep disturbance.  R Axillary LAD She had R axillary enlarged lymph node after COVID Vaccine Seems to come and go  Has apt - [facility] ([place] - Skin Surveillance check tomorrow   Health Maintenance: Completed Mammogram already 2024.  Cologuard completed 12/18/22, next due 11/2025     03/07/2023    9:16 AM 11/03/2022    2:29 AM 07/04/2022   10:27 AM  Depression screen PHQ 2/9  Decreased Interest 0 0 0  Down, Depressed, Hopeless 0 0 0  PHQ - 2 Score 0 0 0  Altered sleeping 0    Tired, decreased energy 0    Change in appetite 0    Feeling bad or failure about yourself  0    Trouble concentrating 0     Moving slowly or fidgety/restless 0    Suicidal thoughts 0    PHQ-9 Score 0    Difficult doing work/chores Not difficult at all      Social History   Tobacco Use   Smoking status: Former    Current packs/day: 0.00    Average packs/day: 0.3 packs/day for 10.0 years (2.5 ttl pk-yrs)    Types: Cigarettes    Start date: 08/27/1998    Quit date: 08/27/2008    Years since quitting: 14.5   Smokeless tobacco: Never  Vaping Use   Vaping status: Never Used  Substance Use Topics   Alcohol use: Not Currently    Comment: very rarely   Drug use: Never    Review of Systems Per HPI unless specifically indicated above     Objective:    BP 134/88 (BP Location: Left Arm, Patient Position: Sitting, Cuff Size: Normal)   Pulse (!) 102   Temp 99 F (37.2 C) (Oral)   Resp 17   Ht 5\' 6"  (1.676 m)   Wt 202 lb 3.2 oz (91.7 kg)   LMP 02/21/2023   SpO2 100%   BMI 32.64 kg/m   Wt Readings from Last 3 Encounters:  03/07/23 202 lb 3.2 oz (91.7 kg)  11/12/22 203 lb (92.1 kg)  11/02/22 206 lb 3.2 oz (93.5 kg)    Physical Exam Vitals and nursing note  reviewed.  Constitutional:      General: She is not in acute distress.    Appearance: She is well-developed. She is not diaphoretic.     Comments: Well-appearing, comfortable, cooperative  HENT:     Head: Normocephalic and atraumatic.  Eyes:     General:        Right eye: No discharge.        Left eye: No discharge.     Conjunctiva/sclera: Conjunctivae normal.  Neck:     Thyroid: No thyromegaly.  Cardiovascular:     Rate and Rhythm: Normal rate and regular rhythm.     Heart sounds: Normal heart sounds. No murmur heard. Pulmonary:     Effort: Pulmonary effort is normal. No respiratory distress.     Breath sounds: Normal breath sounds. No wheezing or rales.  Musculoskeletal:        General: Normal range of motion.     Cervical back: Normal range of motion and neck supple.  Lymphadenopathy:     Cervical: No cervical adenopathy.  Skin:     General: Skin is warm and dry.     Findings: No erythema or rash.     Comments: Difficult to palpate deeper R Axillary nodular density.  Neurological:     Mental Status: She is alert and oriented to person, place, and time.  Psychiatric:        Behavior: Behavior normal.     Comments: Well groomed, good eye contact, normal speech and thoughts      Results for orders placed or performed in visit on 03/02/23  Hemoglobin A1c  Result Value Ref Range   Hgb A1c MFr Bld 5.6 4.8 - 5.6 %   Est. average glucose Bld gHb Est-mCnc 114 mg/dL      Assessment & Plan:   Problem List Items Addressed This Visit     Elevated BP without diagnosis of hypertension   Other Visit Diagnoses     Elevated hemoglobin A1c    -  Primary   Axillary lymphadenopathy       Relevant Orders   [place] LIMITED ULTRASOUND INCLUDING AXILLA RIGHT BREAST   MM 3D DIAGNOSTIC MAMMOGRAM UNILATERAL RIGHT BREAST       #Elevated BP w/o dx HYPERTENSION Chronic problem Seems to be white coat HYPERTENSION based on home readings and in office readings. Calibrated her upper arm, Omron BP cuff. Initial reading here in office remains high, repeat reading much improved. Home readings improved since last visit, overall average is normal for SBP and mild elevated DBP 85-89 range. Agree with continued lifestyle intervention and home monitoring. Follow-up 6 months now, and reconsider if need low dose therapy in future if DBP remains >85 avg  #Elevated A1c Significantly improved A1c from 5.9 down to 5.6 with lifestyle diet changes Goal for still improving physical activity No medication required Repeat POC A1c next time. If need can do [facility] A1c lab draw  #R Axillary Lymphadenopathy Episodic since COVID vaccine in past. No breast abnormality. Prior normal screening mammogram earlier this year 2024. Question on exam if palpable LAD. Pursue R breast axillary ultrasound and diagnostic mammogram, ordered / scheduled [facility]  [place]   Orders Placed This Encounter  Procedures   [place] LIMITED ULTRASOUND INCLUDING AXILLA RIGHT BREAST    Standing Status:   Future    Standing Expiration Date:   09/07/2023    Order Specific Question:   Reason for Exam (SYMPTOM  OR DIAGNOSIS REQUIRED)    Answer:   right axillary lymph  node present, episodic for years, no breast abnormality    Order Specific Question:   Preferred imaging location?    Answer:   [facility]   MM 3D DIAGNOSTIC MAMMOGRAM UNILATERAL RIGHT BREAST    Standing Status:   Future    Standing Expiration Date:   03/06/2024    Order Specific Question:   Reason for Exam (SYMPTOM  OR DIAGNOSIS REQUIRED)    Answer:   R axillary lymph node episodic flares    Order Specific Question:   Preferred imaging location?    Answer:   [facility]    Order Specific Question:   Is the patient pregnant?    Answer:   No     No orders of the defined types were placed in this encounter.     Follow up plan: Return in about 6 months (around 09/07/2023) for 6 month Elevated A1c Fingerstick, Elevated BP updates.   Saralyn Pilar, DO [facility] 03/07/2023, 9:36 AM

## 2023-03-25 ENCOUNTER — Other Ambulatory Visit: Payer: Self-pay | Admitting: Family Medicine

## 2023-03-25 ENCOUNTER — Ambulatory Visit
Admission: RE | Admit: 2023-03-25 | Discharge: 2023-03-25 | Disposition: A | Discharge status: Home / Self Care | Payer: BC Managed Care – PPO | Source: Ambulatory Visit | Attending: Family Medicine | Admitting: Family Medicine

## 2023-03-25 DIAGNOSIS — R030 Elevated blood-pressure reading, without diagnosis of hypertension: Secondary | ICD-10-CM

## 2023-03-25 DIAGNOSIS — R590 Localized enlarged lymph nodes: Secondary | ICD-10-CM | POA: Diagnosis present

## 2023-03-25 DIAGNOSIS — R7309 Other abnormal glucose: Secondary | ICD-10-CM

## 2023-09-09 ENCOUNTER — Ambulatory Visit: Payer: BC Managed Care – PPO | Admitting: Family Medicine

## 2024-09-21 ENCOUNTER — Emergency Department
Admission: EM | Admit: 2024-09-21 | Discharge: 2024-09-21 | Disposition: A | Discharge status: Home / Self Care | Attending: Emergency Medicine | Admitting: Emergency Medicine

## 2024-09-21 ENCOUNTER — Emergency Department
Admission: EM | Admit: 2024-09-21 | Discharge: 2024-09-21 | Disposition: A | Discharge status: Home / Self Care | Payer: Self-pay

## 2024-09-21 ENCOUNTER — Other Ambulatory Visit: Payer: Self-pay

## 2024-09-21 DIAGNOSIS — Z23 Encounter for immunization: Secondary | ICD-10-CM | POA: Diagnosis present

## 2024-09-21 MED ORDER — AMOXICILLIN-POT CLAVULANATE 875-125 MG PO TABS: 1.0000 | ORAL_TABLET | Freq: Two times a day (BID) | ORAL | 0 refills | Status: AC

## 2024-09-21 MED ADMIN — Rabies Immune Globulin (Human) Inj 300 Unit/2ML (150 Unt/ML): 1800 [IU] | INTRAMUSCULAR | NDC 76125015011

## 2024-09-21 MED ADMIN — Rabies Virus Vaccine, HDC For Inj Susp: 1 mL | INTRAMUSCULAR | NDC 49281024658

## 2024-09-21 MED ADMIN — Tet Tox-Diph-Acell Pertuss Ad Inj 5-2-15.5 LF-LF-MCG/0.5ML: 0.5 mL | INTRAMUSCULAR | NDC 49281040089

## 2024-09-21 MED FILL — Rabies Immune Globulin (Human) Inj 300 Unit/2ML (150 Unt/ML): 20.0000 [IU]/kg | INTRAMUSCULAR | Qty: 2 | Status: AC

## 2024-09-21 NOTE — ED Provider Notes (Signed)
 "   [facility] Emergency Department Provider Note     None    (approximate)   History   No chief complaint on file.   HPI  Shannon Velazquez is a 49 y.o. female presents to the ED for possible rabies exposure from a stray cat.  Patient reports she was feeding and tending to a stray cat around her home last week. She noticed an open wound to her left forearm that the stray cat was licking. Patient unable to verify vaccination status or rabies status of the cat. She was able to cage the cat and was advised to quarantine the cat for 10 days, however the cat escaped the cage and the patient has not seen the cat since.   Patient advised by [facility] communicable disease line rabies vaccine.   Patient is in no pain, no fever, chills or pus like drainage from wound on left forearm.  Unknown tetanus status.    Physical Exam   Triage Vital Signs: ED Triage Vitals  Encounter Vitals Group     BP      Girls Systolic BP Percentile      Girls Diastolic BP Percentile      Boys Systolic BP Percentile      Boys Diastolic BP Percentile      Pulse      Resp      Temp      Temp src      SpO2      Weight      Height      Head Circumference      Peak Flow      Pain Score      Pain Loc      Pain Education      Exclude from Growth Chart     Most recent vital signs: There were no vitals filed for this visit.  General Awake, no distress.  HEENT NCAT. PERRL. EOMI. No rhinorrhea. Mucous membranes are moist.  CV:  Good peripheral perfusion.  RESP:  Normal effort.  ABD:  No distention.  Other:  Left forearm reveals a well healed scratch. No open lesion. No erythema or warmth.  No drainage.  No pain.   ED Results / Procedures / Treatments   Labs (all labs ordered are listed, but only abnormal results are displayed) Labs Reviewed - No data to display  No results found.  PROCEDURES:  Critical Care performed: No  Procedures  MEDICATIONS ORDERED IN  ED: Medications - No data to display  IMPRESSION / MDM / ASSESSMENT AND PLAN / ED COURSE  I reviewed the triage vital signs and the nursing notes.                               49 y.o. female presents to the emergency department for evaluation and treatment of possible rabies exposure. See HPI for further details.   Patient's presentation is most consistent with acute, uncomplicated illness.  Patient is alert and oriented.  She is hemodynamically stable.  Initial vital signs temp 99.1, pulse rate 116 bpm, respiration rate 18, BP 157/92, SpO2 99%.  Suspect elevated pulse rate secondary to whitecoat syndrome.  Vitals significantly improved throughout her duration in the ED.  Pulse rate at discharge 104 bpm.  BP at 144/88.  Patient updated on Tdap.  Administration of rabies vaccine human diploid and rabies immunoglobulin was provided to the patient.  She is advised to  follow-up with [place] urgent care at [facility] or med [facility] for complete rabies series.  She was provided the full schedule.  Augmentin  sent to pharmacy. She is in stable condition for discharge home.  ED return precautions were discussed.   FINAL CLINICAL IMPRESSION(S) / ED DIAGNOSES   Final diagnoses:  Need for rabies vaccination    Rx / DC Orders   ED Discharge Orders          Ordered    amoxicillin -clavulanate (AUGMENTIN ) 875-125 MG tablet  2 times daily        09/21/24 1317             Note:  This document was prepared using Dragon voice recognition software and may include unintentional dictation errors.    Margrette, Xane Amsden A, PA-C 09/21/24 1507    Clarine Ozell LABOR, MD 09/21/24 2019  "

## 2024-09-21 NOTE — Discharge Instructions (Signed)
 You were evaluated in the ED for possible rabies exposure.  Given unknown status of the cat's rabies vaccination, You received the 1st rabies vaccination and immunoglobulin during your ED visit today.  You will need to complete the full rabies series.  You can follow-up with [place] urgent care at [facility] or [facility] health urgent care at [facility] the following days to complete your rabies series:  day 3 (01/29), day 7 (02/02) and day 14 (02/09).  You may also choose to follow-up with a urgent care for the remaining rabies vaccination if that particular urgent care has the rabies in stock.  You have also been prescribed antibiotics and encouraged to take the antibiotics as prescribed on the bottle until dose is complete.  In the interim take ibuprofen for pain as needed and elevate the hand is much as possible to help reduce swelling.  Monitor for signs of infection.  If any signs of infection such as spreading of redness, fever or pus from the area please return to ED.    If you are unable to schedule appointment or walk-in with urgent care you are more than welcome to return to ED.

## 2024-09-21 NOTE — ED Triage Notes (Signed)
 Pt comes in via pov after being possibly exposed to a cat with rabies. Pt states that she took in a stray cat, and the cat was vaccinated, but was supposed to be retested at the beginning of February for rabies. The cat ran away, and they haven't been able to locate it,  to have it tested. Pt instructed by health department to come in and be vaccinated. Pt with no complaints of pain.

## 2024-09-24 ENCOUNTER — Ambulatory Visit
Admission: RE | Admit: 2024-09-24 | Discharge: 2024-09-24 | Disposition: A | Discharge status: Home / Self Care | Attending: Family Medicine | Admitting: Family Medicine

## 2024-09-24 DIAGNOSIS — Z23 Encounter for immunization: Secondary | ICD-10-CM | POA: Diagnosis not present

## 2024-09-24 DIAGNOSIS — Z203 Contact with and (suspected) exposure to rabies: Secondary | ICD-10-CM | POA: Diagnosis not present

## 2024-09-24 MED ORDER — RABIES VIRUS VACCINE, HDC IM SUSR
1.0000 mL | Freq: Once | INTRAMUSCULAR | Status: AC
  Administered 2024-09-24: 1 mL via INTRAMUSCULAR

## 2024-09-24 NOTE — ED Triage Notes (Signed)
 Patient here for follow up rabies shot (2nd vaccine/ day 3). Following a possibel rabies exposure. Initial vaccines received at [facility].   Denies any other complaints.

## 2024-09-28 ENCOUNTER — Ambulatory Visit

## 2024-09-28 ENCOUNTER — Ambulatory Visit
Admission: EM | Admit: 2024-09-28 | Discharge: 2024-09-28 | Disposition: A | Discharge status: Home / Self Care | Source: Home / Self Care

## 2024-09-28 DIAGNOSIS — Z203 Contact with and (suspected) exposure to rabies: Secondary | ICD-10-CM

## 2024-09-28 DIAGNOSIS — Z23 Encounter for immunization: Secondary | ICD-10-CM

## 2024-09-28 MED ORDER — RABIES VIRUS VACCINE, HDC IM SUSR
1.0000 mL | Freq: Once | INTRAMUSCULAR | Status: AC
  Administered 2024-09-28: 1 mL via INTRAMUSCULAR
